# Patient Record
Sex: Female | Born: 1950
Health system: Southern US, Community
[De-identification: ages and names within clinical notes are randomized; demographics above are authoritative.]

## PROBLEM LIST (undated history)

## (undated) DIAGNOSIS — K219 Gastro-esophageal reflux disease without esophagitis: Secondary | ICD-10-CM

## (undated) DIAGNOSIS — K5900 Constipation, unspecified: Secondary | ICD-10-CM

## (undated) DIAGNOSIS — M1990 Unspecified osteoarthritis, unspecified site: Secondary | ICD-10-CM

## (undated) DIAGNOSIS — C4490 Unspecified malignant neoplasm of skin, unspecified: Secondary | ICD-10-CM

## (undated) DIAGNOSIS — R011 Cardiac murmur, unspecified: Secondary | ICD-10-CM

## (undated) DIAGNOSIS — M75100 Unspecified rotator cuff tear or rupture of unspecified shoulder, not specified as traumatic: Secondary | ICD-10-CM

## (undated) HISTORY — PX: FOOT SURGERY: SHX648

## (undated) HISTORY — PX: BLADDER SUSPENSION: SHX72

## (undated) HISTORY — PX: ROTATOR CUFF REPAIR: SHX139

## (undated) HISTORY — PX: BUNIONECTOMY: SHX129

## (undated) HISTORY — PX: ABDOMINAL HYSTERECTOMY: SHX81

## (undated) HISTORY — DX: Constipation, unspecified: K59.00

---

## 2015-03-13 DIAGNOSIS — I10 Essential (primary) hypertension: Secondary | ICD-10-CM | POA: Diagnosis not present

## 2015-03-13 DIAGNOSIS — K219 Gastro-esophageal reflux disease without esophagitis: Secondary | ICD-10-CM | POA: Diagnosis not present

## 2015-03-13 DIAGNOSIS — F3340 Major depressive disorder, recurrent, in remission, unspecified: Secondary | ICD-10-CM | POA: Diagnosis not present

## 2015-03-13 DIAGNOSIS — Z0000 Encounter for general adult medical examination without abnormal findings: Secondary | ICD-10-CM | POA: Diagnosis not present

## 2015-03-13 DIAGNOSIS — N8112 Cystocele, lateral: Secondary | ICD-10-CM | POA: Diagnosis not present

## 2015-03-13 DIAGNOSIS — E785 Hyperlipidemia, unspecified: Secondary | ICD-10-CM | POA: Diagnosis not present

## 2015-03-13 DIAGNOSIS — F419 Anxiety disorder, unspecified: Secondary | ICD-10-CM | POA: Diagnosis not present

## 2015-03-13 DIAGNOSIS — R350 Frequency of micturition: Secondary | ICD-10-CM | POA: Diagnosis not present

## 2015-03-25 DIAGNOSIS — M1990 Unspecified osteoarthritis, unspecified site: Secondary | ICD-10-CM | POA: Diagnosis not present

## 2015-04-14 DIAGNOSIS — J019 Acute sinusitis, unspecified: Secondary | ICD-10-CM | POA: Diagnosis not present

## 2015-04-14 DIAGNOSIS — R040 Epistaxis: Secondary | ICD-10-CM | POA: Diagnosis not present

## 2015-04-18 DIAGNOSIS — F329 Major depressive disorder, single episode, unspecified: Secondary | ICD-10-CM | POA: Diagnosis not present

## 2015-04-18 DIAGNOSIS — Z0001 Encounter for general adult medical examination with abnormal findings: Secondary | ICD-10-CM | POA: Diagnosis not present

## 2015-04-18 DIAGNOSIS — R5382 Chronic fatigue, unspecified: Secondary | ICD-10-CM | POA: Diagnosis not present

## 2015-04-18 DIAGNOSIS — K210 Gastro-esophageal reflux disease with esophagitis: Secondary | ICD-10-CM | POA: Diagnosis not present

## 2015-04-18 DIAGNOSIS — E782 Mixed hyperlipidemia: Secondary | ICD-10-CM | POA: Diagnosis not present

## 2015-04-21 DIAGNOSIS — E782 Mixed hyperlipidemia: Secondary | ICD-10-CM | POA: Diagnosis not present

## 2015-04-21 DIAGNOSIS — J301 Allergic rhinitis due to pollen: Secondary | ICD-10-CM | POA: Diagnosis not present

## 2015-04-21 DIAGNOSIS — Z1389 Encounter for screening for other disorder: Secondary | ICD-10-CM | POA: Diagnosis not present

## 2015-04-21 DIAGNOSIS — K219 Gastro-esophageal reflux disease without esophagitis: Secondary | ICD-10-CM | POA: Diagnosis not present

## 2015-04-21 DIAGNOSIS — F331 Major depressive disorder, recurrent, moderate: Secondary | ICD-10-CM | POA: Diagnosis not present

## 2015-04-21 DIAGNOSIS — Z23 Encounter for immunization: Secondary | ICD-10-CM | POA: Diagnosis not present

## 2015-04-21 DIAGNOSIS — Z0001 Encounter for general adult medical examination with abnormal findings: Secondary | ICD-10-CM | POA: Diagnosis not present

## 2015-04-21 DIAGNOSIS — Z9189 Other specified personal risk factors, not elsewhere classified: Secondary | ICD-10-CM | POA: Diagnosis not present

## 2015-05-05 DIAGNOSIS — M5136 Other intervertebral disc degeneration, lumbar region: Secondary | ICD-10-CM | POA: Diagnosis not present

## 2015-06-10 DIAGNOSIS — Z1231 Encounter for screening mammogram for malignant neoplasm of breast: Secondary | ICD-10-CM | POA: Diagnosis not present

## 2015-06-10 DIAGNOSIS — M1990 Unspecified osteoarthritis, unspecified site: Secondary | ICD-10-CM | POA: Diagnosis not present

## 2015-06-15 DIAGNOSIS — M5431 Sciatica, right side: Secondary | ICD-10-CM | POA: Diagnosis not present

## 2015-06-15 DIAGNOSIS — I10 Essential (primary) hypertension: Secondary | ICD-10-CM | POA: Diagnosis not present

## 2015-06-15 DIAGNOSIS — G250 Essential tremor: Secondary | ICD-10-CM | POA: Diagnosis not present

## 2015-07-30 DIAGNOSIS — M1990 Unspecified osteoarthritis, unspecified site: Secondary | ICD-10-CM | POA: Diagnosis not present

## 2015-08-20 DIAGNOSIS — M2022 Hallux rigidus, left foot: Secondary | ICD-10-CM | POA: Diagnosis not present

## 2015-08-20 DIAGNOSIS — M19072 Primary osteoarthritis, left ankle and foot: Secondary | ICD-10-CM | POA: Diagnosis not present

## 2015-09-29 DIAGNOSIS — Z4789 Encounter for other orthopedic aftercare: Secondary | ICD-10-CM | POA: Diagnosis not present

## 2015-10-22 DIAGNOSIS — E782 Mixed hyperlipidemia: Secondary | ICD-10-CM | POA: Diagnosis not present

## 2015-10-22 DIAGNOSIS — K219 Gastro-esophageal reflux disease without esophagitis: Secondary | ICD-10-CM | POA: Diagnosis not present

## 2015-10-22 DIAGNOSIS — Z9189 Other specified personal risk factors, not elsewhere classified: Secondary | ICD-10-CM | POA: Diagnosis not present

## 2015-10-22 DIAGNOSIS — F331 Major depressive disorder, recurrent, moderate: Secondary | ICD-10-CM | POA: Diagnosis not present

## 2015-10-22 DIAGNOSIS — J301 Allergic rhinitis due to pollen: Secondary | ICD-10-CM | POA: Diagnosis not present

## 2015-10-22 DIAGNOSIS — Z1212 Encounter for screening for malignant neoplasm of rectum: Secondary | ICD-10-CM | POA: Diagnosis not present

## 2015-11-03 DIAGNOSIS — Z4789 Encounter for other orthopedic aftercare: Secondary | ICD-10-CM | POA: Diagnosis not present

## 2016-01-29 DIAGNOSIS — Z23 Encounter for immunization: Secondary | ICD-10-CM | POA: Diagnosis not present

## 2016-02-05 DIAGNOSIS — S139XXA Sprain of joints and ligaments of unspecified parts of neck, initial encounter: Secondary | ICD-10-CM | POA: Diagnosis not present

## 2016-02-22 DIAGNOSIS — D485 Neoplasm of uncertain behavior of skin: Secondary | ICD-10-CM | POA: Diagnosis not present

## 2016-02-22 DIAGNOSIS — L905 Scar conditions and fibrosis of skin: Secondary | ICD-10-CM | POA: Diagnosis not present

## 2016-02-22 DIAGNOSIS — L821 Other seborrheic keratosis: Secondary | ICD-10-CM | POA: Diagnosis not present

## 2016-02-22 DIAGNOSIS — D2261 Melanocytic nevi of right upper limb, including shoulder: Secondary | ICD-10-CM | POA: Diagnosis not present

## 2016-02-22 DIAGNOSIS — D2262 Melanocytic nevi of left upper limb, including shoulder: Secondary | ICD-10-CM | POA: Diagnosis not present

## 2016-02-22 DIAGNOSIS — Z85828 Personal history of other malignant neoplasm of skin: Secondary | ICD-10-CM | POA: Diagnosis not present

## 2016-02-22 DIAGNOSIS — D1801 Hemangioma of skin and subcutaneous tissue: Secondary | ICD-10-CM | POA: Diagnosis not present

## 2016-02-22 DIAGNOSIS — L570 Actinic keratosis: Secondary | ICD-10-CM | POA: Diagnosis not present

## 2016-02-25 DIAGNOSIS — K5901 Slow transit constipation: Secondary | ICD-10-CM | POA: Diagnosis not present

## 2016-02-25 DIAGNOSIS — Z1212 Encounter for screening for malignant neoplasm of rectum: Secondary | ICD-10-CM | POA: Diagnosis not present

## 2016-02-25 DIAGNOSIS — Z6830 Body mass index (BMI) 30.0-30.9, adult: Secondary | ICD-10-CM | POA: Diagnosis not present

## 2016-03-02 ENCOUNTER — Encounter (INDEPENDENT_AMBULATORY_CARE_PROVIDER_SITE_OTHER): Payer: Self-pay | Admitting: Internal Medicine

## 2016-03-02 ENCOUNTER — Encounter (INDEPENDENT_AMBULATORY_CARE_PROVIDER_SITE_OTHER): Payer: Self-pay

## 2016-03-10 ENCOUNTER — Ambulatory Visit (INDEPENDENT_AMBULATORY_CARE_PROVIDER_SITE_OTHER): Payer: Medicare Other | Admitting: Internal Medicine

## 2016-03-11 ENCOUNTER — Encounter (INDEPENDENT_AMBULATORY_CARE_PROVIDER_SITE_OTHER): Payer: Self-pay | Admitting: Internal Medicine

## 2016-04-15 DIAGNOSIS — J0101 Acute recurrent maxillary sinusitis: Secondary | ICD-10-CM | POA: Diagnosis not present

## 2016-04-15 DIAGNOSIS — M545 Low back pain: Secondary | ICD-10-CM | POA: Diagnosis not present

## 2016-04-15 DIAGNOSIS — Z6830 Body mass index (BMI) 30.0-30.9, adult: Secondary | ICD-10-CM | POA: Diagnosis not present

## 2016-04-20 DIAGNOSIS — S161XXA Strain of muscle, fascia and tendon at neck level, initial encounter: Secondary | ICD-10-CM | POA: Diagnosis not present

## 2016-04-20 DIAGNOSIS — Z111 Encounter for screening for respiratory tuberculosis: Secondary | ICD-10-CM | POA: Diagnosis not present

## 2016-04-20 DIAGNOSIS — Z021 Encounter for pre-employment examination: Secondary | ICD-10-CM | POA: Diagnosis not present

## 2016-04-20 DIAGNOSIS — Z6830 Body mass index (BMI) 30.0-30.9, adult: Secondary | ICD-10-CM | POA: Diagnosis not present

## 2016-04-28 ENCOUNTER — Encounter (INDEPENDENT_AMBULATORY_CARE_PROVIDER_SITE_OTHER): Payer: Self-pay | Admitting: Internal Medicine

## 2016-04-28 ENCOUNTER — Encounter (INDEPENDENT_AMBULATORY_CARE_PROVIDER_SITE_OTHER): Payer: Self-pay | Admitting: *Deleted

## 2016-04-28 ENCOUNTER — Other Ambulatory Visit (INDEPENDENT_AMBULATORY_CARE_PROVIDER_SITE_OTHER): Payer: Self-pay | Admitting: Internal Medicine

## 2016-04-28 ENCOUNTER — Ambulatory Visit (INDEPENDENT_AMBULATORY_CARE_PROVIDER_SITE_OTHER): Payer: Medicare Other | Admitting: Internal Medicine

## 2016-04-28 ENCOUNTER — Telehealth (INDEPENDENT_AMBULATORY_CARE_PROVIDER_SITE_OTHER): Payer: Self-pay | Admitting: *Deleted

## 2016-04-28 DIAGNOSIS — Z86010 Personal history of colonic polyps: Secondary | ICD-10-CM | POA: Insufficient documentation

## 2016-04-28 DIAGNOSIS — K5909 Other constipation: Secondary | ICD-10-CM

## 2016-04-28 DIAGNOSIS — K5900 Constipation, unspecified: Secondary | ICD-10-CM

## 2016-04-28 HISTORY — DX: Constipation, unspecified: K59.00

## 2016-04-28 MED ORDER — PEG-KCL-NACL-NASULF-NA ASC-C 100 G PO SOLR: 1.0000 | Freq: Once | ORAL | 0 refills | Status: AC

## 2016-04-28 NOTE — Patient Instructions (Signed)
The risks and benefits such as perforation, bleeding, and infection were reviewed with the patient and is agreeable. 

## 2016-04-28 NOTE — Progress Notes (Signed)
   Subjective:    Patient ID: Dominique Brooks, female    DOB: 1950/10/06, 65 y.o.   MRN: [id number]  HPI Referred by Dr. Quillian Quince for constipation/screening colonoscopy. She had a colonoscopy 8 yrs ago at [facility] and she says she had a couple of polyps. Today she says she has rt sided abdominal pain and constipation. She has to strain to have a BM. The pain comes and goes when she is constipated. Rt sided abdominal pain with constipation. She usually has a BM one daily. She also takes prune juice. She takes Miralax as needed. No melena or BRRB.  Her appetite is good. No weight loss    Review of Systems Past Medical History:  Diagnosis Date  . Constipation 04/28/2016    No past surgical history on file.  Allergies  Allergen Reactions  . Sulfa Antibiotics     Flushing, red    No current outpatient prescriptions on file prior to visit.   No current facility-administered medications on file prior to visit.        Objective:   Physical Exam Blood pressure 132/80, pulse 64, temperature 97.3 F (36.3 C), height 5\' 7"  (1.702 m), weight 191 lb 9.6 oz (86.9 kg). Alert and oriented. Skin warm and dry. Oral mucosa is moist.   . Sclera anicteric, conjunctivae is pink. Thyroid not enlarged. No cervical lymphadenopathy. Lungs clear. Heart regular rate and rhythm.  Abdomen is soft. Bowel sounds are positive. No hepatomegaly. No abdominal masses felt. No tenderness.  No edema to lower extremities.         Assessment & Plan:  Constipation.. Take Miralax 1 scoop daily. Colonoscopy for hx of colon polyp.  The risks and benefits such as perforation, bleeding, and infection were reviewed with the patient and is agreeable.

## 2016-04-28 NOTE — Telephone Encounter (Signed)
Patient needs movi prep 

## 2016-05-18 DIAGNOSIS — N390 Urinary tract infection, site not specified: Secondary | ICD-10-CM | POA: Diagnosis not present

## 2016-05-18 DIAGNOSIS — I10 Essential (primary) hypertension: Secondary | ICD-10-CM | POA: Diagnosis not present

## 2016-05-18 DIAGNOSIS — E785 Hyperlipidemia, unspecified: Secondary | ICD-10-CM | POA: Diagnosis not present

## 2016-05-19 DIAGNOSIS — F325 Major depressive disorder, single episode, in full remission: Secondary | ICD-10-CM | POA: Diagnosis not present

## 2016-05-19 DIAGNOSIS — E785 Hyperlipidemia, unspecified: Secondary | ICD-10-CM | POA: Diagnosis not present

## 2016-05-19 DIAGNOSIS — N390 Urinary tract infection, site not specified: Secondary | ICD-10-CM | POA: Diagnosis not present

## 2016-05-19 DIAGNOSIS — K219 Gastro-esophageal reflux disease without esophagitis: Secondary | ICD-10-CM | POA: Diagnosis not present

## 2016-05-19 DIAGNOSIS — G250 Essential tremor: Secondary | ICD-10-CM | POA: Diagnosis not present

## 2016-05-19 DIAGNOSIS — I10 Essential (primary) hypertension: Secondary | ICD-10-CM | POA: Diagnosis not present

## 2016-05-19 DIAGNOSIS — Z0000 Encounter for general adult medical examination without abnormal findings: Secondary | ICD-10-CM | POA: Diagnosis not present

## 2016-05-19 DIAGNOSIS — I341 Nonrheumatic mitral (valve) prolapse: Secondary | ICD-10-CM | POA: Diagnosis not present

## 2016-05-20 DIAGNOSIS — E782 Mixed hyperlipidemia: Secondary | ICD-10-CM | POA: Diagnosis not present

## 2016-05-20 DIAGNOSIS — R5382 Chronic fatigue, unspecified: Secondary | ICD-10-CM | POA: Diagnosis not present

## 2016-05-20 DIAGNOSIS — K219 Gastro-esophageal reflux disease without esophagitis: Secondary | ICD-10-CM | POA: Diagnosis not present

## 2016-05-23 DIAGNOSIS — J301 Allergic rhinitis due to pollen: Secondary | ICD-10-CM | POA: Diagnosis not present

## 2016-05-23 DIAGNOSIS — Z9189 Other specified personal risk factors, not elsewhere classified: Secondary | ICD-10-CM | POA: Diagnosis not present

## 2016-05-23 DIAGNOSIS — Z23 Encounter for immunization: Secondary | ICD-10-CM | POA: Diagnosis not present

## 2016-05-23 DIAGNOSIS — E782 Mixed hyperlipidemia: Secondary | ICD-10-CM | POA: Diagnosis not present

## 2016-05-23 DIAGNOSIS — Z0001 Encounter for general adult medical examination with abnormal findings: Secondary | ICD-10-CM | POA: Diagnosis not present

## 2016-05-23 DIAGNOSIS — F331 Major depressive disorder, recurrent, moderate: Secondary | ICD-10-CM | POA: Diagnosis not present

## 2016-05-23 DIAGNOSIS — Z6829 Body mass index (BMI) 29.0-29.9, adult: Secondary | ICD-10-CM | POA: Diagnosis not present

## 2016-05-23 DIAGNOSIS — K219 Gastro-esophageal reflux disease without esophagitis: Secondary | ICD-10-CM | POA: Diagnosis not present

## 2016-06-02 DIAGNOSIS — L821 Other seborrheic keratosis: Secondary | ICD-10-CM | POA: Diagnosis not present

## 2016-06-02 DIAGNOSIS — Z85828 Personal history of other malignant neoplasm of skin: Secondary | ICD-10-CM | POA: Diagnosis not present

## 2016-06-02 DIAGNOSIS — L570 Actinic keratosis: Secondary | ICD-10-CM | POA: Diagnosis not present

## 2016-06-22 ENCOUNTER — Ambulatory Visit (HOSPITAL_COMMUNITY)
Admission: RE | Admit: 2016-06-22 | Discharge: 2016-06-22 | Disposition: A | Discharge status: Home / Self Care | Payer: Medicare Other | Source: Ambulatory Visit | Attending: Internal Medicine | Admitting: Internal Medicine

## 2016-06-22 ENCOUNTER — Encounter (HOSPITAL_COMMUNITY): Payer: Self-pay | Admitting: *Deleted

## 2016-06-22 ENCOUNTER — Encounter (HOSPITAL_COMMUNITY)
Admission: RE | Disposition: A | Discharge status: Home / Self Care | Payer: Self-pay | Source: Ambulatory Visit | Attending: Internal Medicine

## 2016-06-22 DIAGNOSIS — K219 Gastro-esophageal reflux disease without esophagitis: Secondary | ICD-10-CM | POA: Diagnosis not present

## 2016-06-22 DIAGNOSIS — Z09 Encounter for follow-up examination after completed treatment for conditions other than malignant neoplasm: Secondary | ICD-10-CM | POA: Diagnosis not present

## 2016-06-22 DIAGNOSIS — D125 Benign neoplasm of sigmoid colon: Secondary | ICD-10-CM | POA: Diagnosis not present

## 2016-06-22 DIAGNOSIS — K5730 Diverticulosis of large intestine without perforation or abscess without bleeding: Secondary | ICD-10-CM | POA: Diagnosis not present

## 2016-06-22 DIAGNOSIS — Z1211 Encounter for screening for malignant neoplasm of colon: Secondary | ICD-10-CM | POA: Diagnosis not present

## 2016-06-22 DIAGNOSIS — Z86010 Personal history of colonic polyps: Secondary | ICD-10-CM

## 2016-06-22 DIAGNOSIS — Z79899 Other long term (current) drug therapy: Secondary | ICD-10-CM | POA: Insufficient documentation

## 2016-06-22 HISTORY — DX: Gastro-esophageal reflux disease without esophagitis: K21.9

## 2016-06-22 HISTORY — PX: COLONOSCOPY: SHX5424

## 2016-06-22 HISTORY — PX: POLYPECTOMY: SHX5525

## 2016-06-22 SURGERY — COLONOSCOPY
Anesthesia: Moderate Sedation

## 2016-06-22 MED ORDER — MIDAZOLAM HCL 5 MG/5ML IJ SOLN
INTRAMUSCULAR | Status: DC | PRN
  Administered 2016-06-22 (×4): 2 mg via INTRAVENOUS

## 2016-06-22 MED ORDER — MEPERIDINE HCL 50 MG/ML IJ SOLN
INTRAMUSCULAR | Status: DC | PRN
  Administered 2016-06-22 (×2): 25 mg via INTRAVENOUS

## 2016-06-22 MED ORDER — MEPERIDINE HCL 50 MG/ML IJ SOLN
INTRAMUSCULAR | Status: AC
  Filled 2016-06-22: qty 1

## 2016-06-22 MED ORDER — MIDAZOLAM HCL 5 MG/5ML IJ SOLN
INTRAMUSCULAR | Status: AC
  Filled 2016-06-22: qty 10

## 2016-06-22 MED ORDER — STERILE WATER FOR IRRIGATION IR SOLN
Status: DC | PRN
  Administered 2016-06-22: 13:00:00

## 2016-06-22 MED ORDER — SODIUM CHLORIDE 0.9 % IV SOLN
INTRAVENOUS | Status: DC
  Administered 2016-06-22: 13:00:00 via INTRAVENOUS

## 2016-06-22 NOTE — Discharge Instructions (Signed)
Resume usual medications and high fiber diet. No driving for 24 hours. Physician will call with biopsy results.      Colonoscopy, Adult, Care After This sheet gives you information about how to care for yourself after your procedure. Your doctor may also give you more specific instructions. If you have problems or questions, call your doctor. Follow these instructions at home: General instructions  For the first 24 hours after the procedure:  Do not drive or use machinery.  Do not sign important documents.  Do not drink alcohol.  Do your daily activities more slowly than normal.  Eat foods that are soft and easy to digest.  Rest often.  Take over-the-counter or prescription medicines only as told by your doctor.  It is up to you to get the results of your procedure. Ask your doctor, or the department performing the procedure, when your results will be ready. To help cramping and bloating:  Try walking around.  Put heat on your belly (abdomen) as told by your doctor. Use a heat source that your doctor recommends, such as a moist heat pack or a heating pad.  Put a towel between your skin and the heat source.  Leave the heat on for 20-30 minutes.  Remove the heat if your skin turns bright red. This is especially important if you cannot feel pain, heat, or cold. You can get burned. Eating and drinking  Drink enough fluid to keep your pee (urine) clear or pale yellow.  Return to your normal diet as told by your doctor. Avoid heavy or fried foods that are hard to digest.  Avoid drinking alcohol for as long as told by your doctor. Contact a doctor if:  You have blood in your poop (stool) 2-3 days after the procedure. Get help right away if:  You have more than a small amount of blood in your poop.  You see large clumps of tissue (blood clots) in your poop.  Your belly is swollen.  You feel sick to your stomach (nauseous).  You throw up (vomit).  You have a  fever.  You have belly pain that gets worse, and medicine does not help your pain.    Colon Polyps Introduction Polyps are tissue growths inside the body. Polyps can grow in many places, including the large intestine (colon). A polyp may be a round bump or a mushroom-shaped growth. You could have one polyp or several. Most colon polyps are noncancerous (benign). However, some colon polyps can become cancerous over time. What are the causes? The exact cause of colon polyps is not known. What increases the risk? This condition is more likely to develop in people who:  Have a family history of colon cancer or colon polyps.  Are older than 66 or older than 45 if they are African American.  Have inflammatory bowel disease, such as ulcerative colitis or Crohn disease.  Are overweight.  Smoke cigarettes.  Do not get enough exercise.  Drink too much alcohol.  Eat a diet that is:  High in fat and red meat.  Low in fiber.  Had childhood cancer that was treated with abdominal radiation. What are the signs or symptoms? Most polyps do not cause symptoms. If you have symptoms, they may include:  Blood coming from your rectum when having a bowel movement.  Blood in your stool.The stool may look dark red or black.  A change in bowel habits, such as constipation or diarrhea. How is this diagnosed? This condition is diagnosed  with a colonoscopy. This is a procedure that uses a lighted, flexible scope to look at the inside of your colon. How is this treated? Treatment for this condition involves removing any polyps that are found. Those polyps will then be tested for cancer. If cancer is found, your health care provider will talk to you about options for colon cancer treatment. Follow these instructions at home: Diet  Eat plenty of fiber, such as fruits, vegetables, and whole grains.  Eat foods that are high in calcium and vitamin D, such as milk, cheese, yogurt, eggs, liver, fish,  and broccoli.  Limit foods high in fat, red meats, and processed meats, such as hot dogs, sausage, bacon, and lunch meats.  Maintain a healthy weight, or lose weight if recommended by your health care provider. General instructions  Do not smoke cigarettes.  Do not drink alcohol excessively.  Keep all follow-up visits as told by your health care provider. This is important. This includes keeping regularly scheduled colonoscopies. Talk to your health care provider about when you need a colonoscopy.  Exercise every day or as told by your health care provider. Contact a health care provider if:  You have new or worsening bleeding during a bowel movement.  You have new or increased blood in your stool.  You have a change in bowel habits.  You unexpectedly lose weight.    High-Fiber Diet Fiber, also called dietary fiber, is a type of carbohydrate found in fruits, vegetables, whole grains, and beans. A high-fiber diet can have many health benefits. Your health care provider may recommend a high-fiber diet to help:  Prevent constipation. Fiber can make your bowel movements more regular.  Lower your cholesterol.  Relieve hemorrhoids, uncomplicated diverticulosis, or irritable bowel syndrome.  Prevent overeating as part of a weight-loss plan.  Prevent heart disease, type 2 diabetes, and certain cancers. What is my plan? The recommended daily intake of fiber includes:  38 grams for men under age 23.  4 grams for men over age 63.  21 grams for women under age 76.  1 grams for women over age 91. You can get the recommended daily intake of dietary fiber by eating a variety of fruits, vegetables, grains, and beans. Your health care provider may also recommend a fiber supplement if it is not possible to get enough fiber through your diet. What do I need to know about a high-fiber diet?  Fiber supplements have not been widely studied for their effectiveness, so it is better to  get fiber through food sources.  Always check the fiber content on thenutrition facts label of any prepackaged food. Look for foods that contain at least 5 grams of fiber per serving.  Ask your dietitian if you have questions about specific foods that are related to your condition, especially if those foods are not listed in the following section.  Increase your daily fiber consumption gradually. Increasing your intake of dietary fiber too quickly may cause bloating, cramping, or gas.  Drink plenty of water. Water helps you to digest fiber. What foods can I eat? Grains  Whole-grain breads. Multigrain cereal. Oats and oatmeal. Brown rice. Barley. Bulgur wheat. Carey. Bran muffins. Popcorn. Rye wafer crackers. Vegetables  Sweet potatoes. Spinach. Kale. Artichokes. Cabbage. Broccoli. Green peas. Carrots. Squash. Fruits  Berries. Pears. Apples. Oranges. Avocados. Prunes and raisins. Dried figs. Meats and Other Protein Sources  Navy, kidney, pinto, and soy beans. Split peas. Lentils. Nuts and seeds. Dairy  Fiber-fortified yogurt. Beverages  Fiber-fortified  soy milk. Fiber-fortified orange juice. Other  Fiber bars. The items listed above may not be a complete list of recommended foods or beverages. Contact your dietitian for more options.  What foods are not recommended? Grains  White bread. Pasta made with refined flour. White rice. Vegetables  Fried potatoes. Canned vegetables. Well-cooked vegetables. Fruits  Fruit juice. Cooked, strained fruit. Meats and Other Protein Sources  Fatty cuts of meat. Fried Sales executive or fried fish. Dairy  Milk. Yogurt. Cream cheese. Sour cream. Beverages  Soft drinks. Other  Cakes and pastries. Butter and oils. The items listed above may not be a complete list of foods and beverages to avoid. Contact your dietitian for more information.  What are some tips for including high-fiber foods in my diet?  Eat a wide variety of high-fiber foods.  Make  sure that half of all grains consumed each day are whole grains.  Replace breads and cereals made from refined flour or white flour with whole-grain breads and cereals.  Replace white rice with brown rice, bulgur wheat, or millet.  Start the day with a breakfast that is high in fiber, such as a cereal that contains at least 5 grams of fiber per serving.  Use beans in place of meat in soups, salads, or pasta.  Eat high-fiber snacks, such as berries, raw vegetables, nuts, or popcorn. This information is not intended to replace advice given to you by your health care provider. Make sure you discuss any questions you have with your health care provider. Document Released: 04/25/2005 Document Revised: 10/01/2015 Document Reviewed: 10/08/2013 Elsevier Interactive Patient Education  2017 [employer].

## 2016-06-22 NOTE — H&P (Signed)
Dominique Brooks is an 66 y.o. female.   Chief Complaint: Patient is here for colonoscopy. HPI: Asian is 1 old Caucasian female who is here for screening colonoscopy. She had colonoscopy 10 years ago when she was living in an [place]. She was told she had polyps however none were removed. It appears there may have been misunderstanding or she has had rectal hyperplastic polyps which are not removed. At any rate she is not having rectal bleeding and has not noted change in her bowel habits. Family history is negative for CRC.  Past Medical History:  Diagnosis Date  . Constipation 04/28/2016  . GERD (gastroesophageal reflux disease)        Stress disorder  Past Surgical History:  Procedure Laterality Date  . ABDOMINAL HYSTERECTOMY    . BLADDER SUSPENSION    . BUNIONECTOMY     x 2    History reviewed. No pertinent family history. Social History:  reports that she has never smoked. She has never used smokeless tobacco. She reports that she does not drink alcohol or use drugs.  Allergies:  Allergies  Allergen Reactions  . Sulfa Antibiotics     Flushing, red    Medications Prior to Admission  Medication Sig Dispense Refill  . ALPRAZolam (XANAX) 0.5 MG tablet Take 0.25-0.5 mg by mouth 2 (two) times daily as needed (muscle spasms).    . citalopram (CELEXA) 40 MG tablet Take 20 mg by mouth daily.    . fexofenadine (ALLEGRA) 180 MG tablet Take 180 mg by mouth daily.    . Multiple Vitamins-Minerals (MULTIVITAMIN WITH MINERALS) tablet Take 1 tablet by mouth daily.    . pantoprazole (PROTONIX) 40 MG tablet Take 40 mg by mouth daily.      No results found for this or any previous visit (from the past 48 hour(s)). No results found.  ROS  Blood pressure 114/90, pulse 62, temperature 97.8 F (36.6 C), temperature source Oral, resp. rate 12, SpO2 97 %. Physical Exam  Constitutional: She appears well-developed and well-nourished.  HENT:  Mouth/Throat: Oropharynx is clear and moist.   Eyes: Conjunctivae are normal. No scleral icterus.  Neck: No thyromegaly present.  Cardiovascular: Normal rate, regular rhythm and normal heart sounds.   No murmur heard. Respiratory: Effort normal and breath sounds normal.  GI:  Abdomen is soft and nontender without organomegaly or masses.  Lymphadenopathy:    She has no cervical adenopathy.     Assessment/Plan Average risk screening colonoscopy.  Hildred Laser, MD 06/22/2016, 1:08 PM

## 2016-06-22 NOTE — Op Note (Addendum)
[facility] Patient Name: Dominique Brooks Procedure Date: 06/22/2016 11:48 AM MRN: [id number] Date of Birth: Apr 04, 1951 Attending MD: Hildred Laser , MD CSN: [id number] Age: 66 Admit Type: Outpatient Procedure:                Colonoscopy Indications:              Screening for colorectal malignant neoplasm Providers:                Hildred Laser, MD, Otis Peak B. Sharon Seller, RN, Isabella Stalling, Technician Referring MD:             Gar Ponto, MD Medicines:                Meperidine 50 mg IV, Midazolam 8 mg IV Complications:            No immediate complications. Estimated Blood Loss:     Estimated blood loss was minimal. Procedure:                Pre-Anesthesia Assessment:                           - Prior to the procedure, a History and Physical                            was performed, and patient medications and                            allergies were reviewed. The patient's tolerance of                            previous anesthesia was also reviewed. The risks                            and benefits of the procedure and the sedation                            options and risks were discussed with the patient.                            All questions were answered, and informed consent                            was obtained. Prior Anticoagulants: The patient has                            taken no previous anticoagulant or antiplatelet                            agents. ASA Grade Assessment: II - A patient with                            mild systemic disease. After reviewing the risks  and benefits, the patient was deemed in                            satisfactory condition to undergo the procedure.                           After obtaining informed consent, the colonoscope                            was passed under direct vision. Throughout the                            procedure, the patient's blood pressure, pulse, and                             oxygen saturations were monitored continuously. The                            [id number]) was introduced through the                            anus and advanced to the the cecum, identified by                            appendiceal orifice and ileocecal valve. The                            colonoscopy was performed without difficulty. The                            patient tolerated the procedure well. The quality                            of the bowel preparation was good. The ileocecal                            valve, appendiceal orifice, and rectum were                            photographed. Scope In: 1:20:08 PM Scope Out: 1:45:41 PM Scope Withdrawal Time: 0 hours 13 minutes 37 seconds  Total Procedure Duration: 0 hours 25 minutes 33 seconds  Findings:      The perianal and digital rectal examinations were normal.      Multiple small and large-mouthed diverticula were found in the entire       colon.      A 4 mm polyp was found in the sigmoid colon. The polyp was sessile. The       polyp was removed with a cold snare. Resection complete but polyp lost.      The retroflexed view of the distal rectum and anal verge was normal and       showed no anal or rectal abnormalities. Impression:               - Diverticulosis in the entire examined colon.                           -  One 4 mm polyp in the sigmoid colon, removed with                            a cold snare. Resected but lost. endoscopic                            appearence consistent with small adenoma. Moderate Sedation:      Moderate (conscious) sedation was administered by the endoscopy nurse       and supervised by the endoscopist. The following parameters were       monitored: oxygen saturation, heart rate, blood pressure, CO2       capnography and response to care. Total physician intraservice time was       30 minutes. Recommendation:           - Patient has a contact number  available for                            emergencies. The signs and symptoms of potential                            delayed complications were discussed with the                            patient. Return to normal activities tomorrow.                            Written discharge instructions were provided to the                            patient.                           - High fiber diet today.                           - Continue present medications.                           - Repeat colonoscopy in 7 years Procedure Code(s):        --- Professional ---                           [id number], Colonoscopy, flexible; with removal of                            tumor(s), polyp(s), or other lesion(s) by snare                            technique                           99152, Moderate sedation services provided by the                            same physician or other qualified health care  professional performing the diagnostic or                            therapeutic service that the sedation supports,                            requiring the presence of an independent trained                            observer to assist in the monitoring of the                            patient's level of consciousness and physiological                            status; initial 15 minutes of intraservice time,                            patient age 81 years or older                           [phone], Moderate sedation services; each additional                            15 minutes intraservice time Diagnosis Code(s):        --- Professional ---                           Z12.11, Encounter for screening for malignant                            neoplasm of colon                           D12.5, Benign neoplasm of sigmoid colon                           K57.30, Diverticulosis of large intestine without                            perforation or abscess without bleeding CPT copyright  2016 American Medical Association. All rights reserved. The codes documented in this report are preliminary and upon coder review may  be revised to meet current compliance requirements. Hildred Laser, MD Hildred Laser, MD 06/22/2016 1:51:11 PM This report has been signed electronically. Number of Addenda: 0

## 2016-06-23 DIAGNOSIS — Z6830 Body mass index (BMI) 30.0-30.9, adult: Secondary | ICD-10-CM | POA: Diagnosis not present

## 2016-06-23 DIAGNOSIS — J0101 Acute recurrent maxillary sinusitis: Secondary | ICD-10-CM | POA: Diagnosis not present

## 2016-06-29 ENCOUNTER — Encounter (HOSPITAL_COMMUNITY): Payer: Self-pay | Admitting: Internal Medicine

## 2016-07-11 DIAGNOSIS — J019 Acute sinusitis, unspecified: Secondary | ICD-10-CM | POA: Diagnosis not present

## 2016-07-11 DIAGNOSIS — R05 Cough: Secondary | ICD-10-CM | POA: Diagnosis not present

## 2016-07-12 ENCOUNTER — Telehealth (INDEPENDENT_AMBULATORY_CARE_PROVIDER_SITE_OTHER): Payer: Self-pay | Admitting: Internal Medicine

## 2016-07-12 NOTE — Telephone Encounter (Signed)
Patient called, very concerned because she had a colonoscopy on 06/22/16 and has not heard anything regarding the results.  She's hoping and praying everything was ok.  She would like a call back.  [phone]

## 2016-07-12 NOTE — Telephone Encounter (Addendum)
I have looked at patient chart and do not see results under labs that would have created a result for the basket to be reviewed.  I have asked Threasa Beards to look behind me to make sure about specimen.  Thank you   There was no polyp retrieved.  I need Dr. Laural Golden to call patient.

## 2016-07-13 NOTE — Telephone Encounter (Signed)
Talked with Dr.Rehman, he is calling with patient. Forwarded to [place] for documentation.

## 2016-07-14 NOTE — Telephone Encounter (Signed)
Explained to the patient that she had small polyp which was lost. This polyp well documented on endoscopic pictures and also following cold snare. Return for follow-up in colonoscopy in 7 years.

## 2016-07-14 NOTE — Telephone Encounter (Signed)
Talked with patient yesterday.

## 2016-07-14 NOTE — Telephone Encounter (Signed)
7 yr TCS noted in recall 

## 2016-09-07 DIAGNOSIS — Z6830 Body mass index (BMI) 30.0-30.9, adult: Secondary | ICD-10-CM | POA: Diagnosis not present

## 2016-09-07 DIAGNOSIS — R350 Frequency of micturition: Secondary | ICD-10-CM | POA: Diagnosis not present

## 2016-09-16 DIAGNOSIS — Z6829 Body mass index (BMI) 29.0-29.9, adult: Secondary | ICD-10-CM | POA: Diagnosis not present

## 2016-09-16 DIAGNOSIS — J0101 Acute recurrent maxillary sinusitis: Secondary | ICD-10-CM | POA: Diagnosis not present

## 2016-12-02 DIAGNOSIS — Z6828 Body mass index (BMI) 28.0-28.9, adult: Secondary | ICD-10-CM | POA: Diagnosis not present

## 2016-12-02 DIAGNOSIS — M7582 Other shoulder lesions, left shoulder: Secondary | ICD-10-CM | POA: Diagnosis not present

## 2016-12-15 DIAGNOSIS — Z1231 Encounter for screening mammogram for malignant neoplasm of breast: Secondary | ICD-10-CM | POA: Diagnosis not present

## 2016-12-26 DIAGNOSIS — Z6829 Body mass index (BMI) 29.0-29.9, adult: Secondary | ICD-10-CM | POA: Diagnosis not present

## 2016-12-26 DIAGNOSIS — R300 Dysuria: Secondary | ICD-10-CM | POA: Diagnosis not present

## 2016-12-26 DIAGNOSIS — N3000 Acute cystitis without hematuria: Secondary | ICD-10-CM | POA: Diagnosis not present

## 2017-02-14 DIAGNOSIS — Z23 Encounter for immunization: Secondary | ICD-10-CM | POA: Diagnosis not present

## 2017-05-25 DIAGNOSIS — G250 Essential tremor: Secondary | ICD-10-CM | POA: Diagnosis not present

## 2017-05-25 DIAGNOSIS — Z0000 Encounter for general adult medical examination without abnormal findings: Secondary | ICD-10-CM | POA: Diagnosis not present

## 2017-05-25 DIAGNOSIS — F325 Major depressive disorder, single episode, in full remission: Secondary | ICD-10-CM | POA: Diagnosis not present

## 2017-05-25 DIAGNOSIS — N390 Urinary tract infection, site not specified: Secondary | ICD-10-CM | POA: Diagnosis not present

## 2017-05-25 DIAGNOSIS — M47812 Spondylosis without myelopathy or radiculopathy, cervical region: Secondary | ICD-10-CM | POA: Diagnosis not present

## 2017-05-25 DIAGNOSIS — K219 Gastro-esophageal reflux disease without esophagitis: Secondary | ICD-10-CM | POA: Diagnosis not present

## 2017-05-25 DIAGNOSIS — M19011 Primary osteoarthritis, right shoulder: Secondary | ICD-10-CM | POA: Diagnosis not present

## 2017-05-25 DIAGNOSIS — E785 Hyperlipidemia, unspecified: Secondary | ICD-10-CM | POA: Diagnosis not present

## 2017-05-25 DIAGNOSIS — I341 Nonrheumatic mitral (valve) prolapse: Secondary | ICD-10-CM | POA: Diagnosis not present

## 2017-05-25 DIAGNOSIS — M1710 Unilateral primary osteoarthritis, unspecified knee: Secondary | ICD-10-CM | POA: Diagnosis not present

## 2017-05-25 DIAGNOSIS — I10 Essential (primary) hypertension: Secondary | ICD-10-CM | POA: Diagnosis not present

## 2017-05-29 DIAGNOSIS — C44719 Basal cell carcinoma of skin of left lower limb, including hip: Secondary | ICD-10-CM | POA: Diagnosis not present

## 2017-05-29 DIAGNOSIS — L570 Actinic keratosis: Secondary | ICD-10-CM | POA: Diagnosis not present

## 2017-05-29 DIAGNOSIS — D225 Melanocytic nevi of trunk: Secondary | ICD-10-CM | POA: Diagnosis not present

## 2017-05-29 DIAGNOSIS — Z85828 Personal history of other malignant neoplasm of skin: Secondary | ICD-10-CM | POA: Diagnosis not present

## 2017-05-29 DIAGNOSIS — L821 Other seborrheic keratosis: Secondary | ICD-10-CM | POA: Diagnosis not present

## 2017-05-29 DIAGNOSIS — D2271 Melanocytic nevi of right lower limb, including hip: Secondary | ICD-10-CM | POA: Diagnosis not present

## 2017-05-29 DIAGNOSIS — D485 Neoplasm of uncertain behavior of skin: Secondary | ICD-10-CM | POA: Diagnosis not present

## 2017-05-29 DIAGNOSIS — D2272 Melanocytic nevi of left lower limb, including hip: Secondary | ICD-10-CM | POA: Diagnosis not present

## 2017-06-19 DIAGNOSIS — Z85828 Personal history of other malignant neoplasm of skin: Secondary | ICD-10-CM | POA: Diagnosis not present

## 2017-06-19 DIAGNOSIS — C44719 Basal cell carcinoma of skin of left lower limb, including hip: Secondary | ICD-10-CM | POA: Diagnosis not present

## 2017-07-18 DIAGNOSIS — R05 Cough: Secondary | ICD-10-CM | POA: Diagnosis not present

## 2017-07-18 DIAGNOSIS — J3489 Other specified disorders of nose and nasal sinuses: Secondary | ICD-10-CM | POA: Diagnosis not present

## 2017-07-18 DIAGNOSIS — M7581 Other shoulder lesions, right shoulder: Secondary | ICD-10-CM | POA: Diagnosis not present

## 2017-07-18 DIAGNOSIS — M5412 Radiculopathy, cervical region: Secondary | ICD-10-CM | POA: Diagnosis not present

## 2017-07-18 DIAGNOSIS — M19011 Primary osteoarthritis, right shoulder: Secondary | ICD-10-CM | POA: Diagnosis not present

## 2017-08-01 DIAGNOSIS — J209 Acute bronchitis, unspecified: Secondary | ICD-10-CM | POA: Diagnosis not present

## 2017-08-01 DIAGNOSIS — J0101 Acute recurrent maxillary sinusitis: Secondary | ICD-10-CM | POA: Diagnosis not present

## 2017-08-01 DIAGNOSIS — Z6830 Body mass index (BMI) 30.0-30.9, adult: Secondary | ICD-10-CM | POA: Diagnosis not present

## 2017-08-23 DIAGNOSIS — J0101 Acute recurrent maxillary sinusitis: Secondary | ICD-10-CM | POA: Diagnosis not present

## 2017-08-23 DIAGNOSIS — D1722 Benign lipomatous neoplasm of skin and subcutaneous tissue of left arm: Secondary | ICD-10-CM | POA: Diagnosis not present

## 2017-08-23 DIAGNOSIS — Z6830 Body mass index (BMI) 30.0-30.9, adult: Secondary | ICD-10-CM | POA: Diagnosis not present

## 2017-09-30 DIAGNOSIS — H40033 Anatomical narrow angle, bilateral: Secondary | ICD-10-CM | POA: Diagnosis not present

## 2017-09-30 DIAGNOSIS — H2513 Age-related nuclear cataract, bilateral: Secondary | ICD-10-CM | POA: Diagnosis not present

## 2017-10-10 DIAGNOSIS — H04123 Dry eye syndrome of bilateral lacrimal glands: Secondary | ICD-10-CM | POA: Diagnosis not present

## 2017-11-03 DIAGNOSIS — M545 Low back pain: Secondary | ICD-10-CM | POA: Diagnosis not present

## 2017-11-03 DIAGNOSIS — E782 Mixed hyperlipidemia: Secondary | ICD-10-CM | POA: Diagnosis not present

## 2017-11-03 DIAGNOSIS — K210 Gastro-esophageal reflux disease with esophagitis: Secondary | ICD-10-CM | POA: Diagnosis not present

## 2017-11-03 DIAGNOSIS — R5382 Chronic fatigue, unspecified: Secondary | ICD-10-CM | POA: Diagnosis not present

## 2017-11-03 DIAGNOSIS — Z9189 Other specified personal risk factors, not elsewhere classified: Secondary | ICD-10-CM | POA: Diagnosis not present

## 2017-11-07 DIAGNOSIS — Z6830 Body mass index (BMI) 30.0-30.9, adult: Secondary | ICD-10-CM | POA: Diagnosis not present

## 2017-11-07 DIAGNOSIS — E6609 Other obesity due to excess calories: Secondary | ICD-10-CM | POA: Diagnosis not present

## 2017-11-07 DIAGNOSIS — F331 Major depressive disorder, recurrent, moderate: Secondary | ICD-10-CM | POA: Diagnosis not present

## 2017-11-07 DIAGNOSIS — K219 Gastro-esophageal reflux disease without esophagitis: Secondary | ICD-10-CM | POA: Diagnosis not present

## 2017-11-07 DIAGNOSIS — E782 Mixed hyperlipidemia: Secondary | ICD-10-CM | POA: Diagnosis not present

## 2017-11-07 DIAGNOSIS — Z1212 Encounter for screening for malignant neoplasm of rectum: Secondary | ICD-10-CM | POA: Diagnosis not present

## 2017-11-07 DIAGNOSIS — G4700 Insomnia, unspecified: Secondary | ICD-10-CM | POA: Diagnosis not present

## 2017-11-07 DIAGNOSIS — R5382 Chronic fatigue, unspecified: Secondary | ICD-10-CM | POA: Diagnosis not present

## 2017-11-23 DIAGNOSIS — M7581 Other shoulder lesions, right shoulder: Secondary | ICD-10-CM | POA: Diagnosis not present

## 2017-11-28 DIAGNOSIS — Z6830 Body mass index (BMI) 30.0-30.9, adult: Secondary | ICD-10-CM | POA: Diagnosis not present

## 2017-11-28 DIAGNOSIS — G4700 Insomnia, unspecified: Secondary | ICD-10-CM | POA: Diagnosis not present

## 2017-11-28 DIAGNOSIS — R946 Abnormal results of thyroid function studies: Secondary | ICD-10-CM | POA: Diagnosis not present

## 2017-11-28 DIAGNOSIS — M25511 Pain in right shoulder: Secondary | ICD-10-CM | POA: Diagnosis not present

## 2017-11-28 DIAGNOSIS — R5383 Other fatigue: Secondary | ICD-10-CM | POA: Diagnosis not present

## 2017-11-30 DIAGNOSIS — E2839 Other primary ovarian failure: Secondary | ICD-10-CM | POA: Diagnosis not present

## 2017-11-30 DIAGNOSIS — M810 Age-related osteoporosis without current pathological fracture: Secondary | ICD-10-CM | POA: Diagnosis not present

## 2017-12-15 DIAGNOSIS — M542 Cervicalgia: Secondary | ICD-10-CM | POA: Diagnosis not present

## 2017-12-15 DIAGNOSIS — M25511 Pain in right shoulder: Secondary | ICD-10-CM | POA: Diagnosis not present

## 2017-12-18 DIAGNOSIS — Z1231 Encounter for screening mammogram for malignant neoplasm of breast: Secondary | ICD-10-CM | POA: Diagnosis not present

## 2017-12-28 DIAGNOSIS — Z85828 Personal history of other malignant neoplasm of skin: Secondary | ICD-10-CM | POA: Diagnosis not present

## 2017-12-28 DIAGNOSIS — L570 Actinic keratosis: Secondary | ICD-10-CM | POA: Diagnosis not present

## 2017-12-28 DIAGNOSIS — L821 Other seborrheic keratosis: Secondary | ICD-10-CM | POA: Diagnosis not present

## 2017-12-28 DIAGNOSIS — D692 Other nonthrombocytopenic purpura: Secondary | ICD-10-CM | POA: Diagnosis not present

## 2017-12-31 DIAGNOSIS — S61011A Laceration without foreign body of right thumb without damage to nail, initial encounter: Secondary | ICD-10-CM | POA: Diagnosis not present

## 2017-12-31 DIAGNOSIS — S63124A Dislocation of unspecified interphalangeal joint of right thumb, initial encounter: Secondary | ICD-10-CM | POA: Diagnosis not present

## 2017-12-31 DIAGNOSIS — S62631A Displaced fracture of distal phalanx of left index finger, initial encounter for closed fracture: Secondary | ICD-10-CM | POA: Diagnosis not present

## 2017-12-31 DIAGNOSIS — W268XXA Contact with other sharp object(s), not elsewhere classified, initial encounter: Secondary | ICD-10-CM | POA: Diagnosis not present

## 2017-12-31 DIAGNOSIS — S63114A Dislocation of metacarpophalangeal joint of right thumb, initial encounter: Secondary | ICD-10-CM | POA: Diagnosis not present

## 2017-12-31 DIAGNOSIS — Z23 Encounter for immunization: Secondary | ICD-10-CM | POA: Diagnosis not present

## 2018-01-10 DIAGNOSIS — S63104A Unspecified dislocation of right thumb, initial encounter: Secondary | ICD-10-CM | POA: Diagnosis not present

## 2018-01-12 DIAGNOSIS — M25571 Pain in right ankle and joints of right foot: Secondary | ICD-10-CM | POA: Diagnosis not present

## 2018-01-27 DIAGNOSIS — Z23 Encounter for immunization: Secondary | ICD-10-CM | POA: Diagnosis not present

## 2018-01-29 DIAGNOSIS — R5383 Other fatigue: Secondary | ICD-10-CM | POA: Diagnosis not present

## 2018-01-29 DIAGNOSIS — Z6831 Body mass index (BMI) 31.0-31.9, adult: Secondary | ICD-10-CM | POA: Diagnosis not present

## 2018-01-29 DIAGNOSIS — R509 Fever, unspecified: Secondary | ICD-10-CM | POA: Diagnosis not present

## 2018-02-21 DIAGNOSIS — S63104A Unspecified dislocation of right thumb, initial encounter: Secondary | ICD-10-CM | POA: Diagnosis not present

## 2018-04-13 DIAGNOSIS — M25511 Pain in right shoulder: Secondary | ICD-10-CM | POA: Diagnosis not present

## 2018-05-14 DIAGNOSIS — E782 Mixed hyperlipidemia: Secondary | ICD-10-CM | POA: Diagnosis not present

## 2018-05-14 DIAGNOSIS — K210 Gastro-esophageal reflux disease with esophagitis: Secondary | ICD-10-CM | POA: Diagnosis not present

## 2018-05-14 DIAGNOSIS — R5383 Other fatigue: Secondary | ICD-10-CM | POA: Diagnosis not present

## 2018-05-14 DIAGNOSIS — R5382 Chronic fatigue, unspecified: Secondary | ICD-10-CM | POA: Diagnosis not present

## 2018-05-14 DIAGNOSIS — R946 Abnormal results of thyroid function studies: Secondary | ICD-10-CM | POA: Diagnosis not present

## 2018-05-16 DIAGNOSIS — Z1212 Encounter for screening for malignant neoplasm of rectum: Secondary | ICD-10-CM | POA: Diagnosis not present

## 2018-05-16 DIAGNOSIS — Z6830 Body mass index (BMI) 30.0-30.9, adult: Secondary | ICD-10-CM | POA: Diagnosis not present

## 2018-05-16 DIAGNOSIS — Z0001 Encounter for general adult medical examination with abnormal findings: Secondary | ICD-10-CM | POA: Diagnosis not present

## 2018-06-14 DIAGNOSIS — E785 Hyperlipidemia, unspecified: Secondary | ICD-10-CM | POA: Diagnosis not present

## 2018-06-14 DIAGNOSIS — F325 Major depressive disorder, single episode, in full remission: Secondary | ICD-10-CM | POA: Diagnosis not present

## 2018-06-14 DIAGNOSIS — I10 Essential (primary) hypertension: Secondary | ICD-10-CM | POA: Diagnosis not present

## 2018-06-14 DIAGNOSIS — G250 Essential tremor: Secondary | ICD-10-CM | POA: Diagnosis not present

## 2018-06-14 DIAGNOSIS — Z0000 Encounter for general adult medical examination without abnormal findings: Secondary | ICD-10-CM | POA: Diagnosis not present

## 2018-06-14 DIAGNOSIS — I341 Nonrheumatic mitral (valve) prolapse: Secondary | ICD-10-CM | POA: Diagnosis not present

## 2018-06-14 DIAGNOSIS — K219 Gastro-esophageal reflux disease without esophagitis: Secondary | ICD-10-CM | POA: Diagnosis not present

## 2018-06-28 DIAGNOSIS — L821 Other seborrheic keratosis: Secondary | ICD-10-CM | POA: Diagnosis not present

## 2018-06-28 DIAGNOSIS — Z85828 Personal history of other malignant neoplasm of skin: Secondary | ICD-10-CM | POA: Diagnosis not present

## 2018-07-16 DIAGNOSIS — M9902 Segmental and somatic dysfunction of thoracic region: Secondary | ICD-10-CM | POA: Diagnosis not present

## 2018-07-16 DIAGNOSIS — M542 Cervicalgia: Secondary | ICD-10-CM | POA: Diagnosis not present

## 2018-07-16 DIAGNOSIS — M9903 Segmental and somatic dysfunction of lumbar region: Secondary | ICD-10-CM | POA: Diagnosis not present

## 2018-07-16 DIAGNOSIS — S338XXA Sprain of other parts of lumbar spine and pelvis, initial encounter: Secondary | ICD-10-CM | POA: Diagnosis not present

## 2018-07-16 DIAGNOSIS — S233XXA Sprain of ligaments of thoracic spine, initial encounter: Secondary | ICD-10-CM | POA: Diagnosis not present

## 2018-07-16 DIAGNOSIS — M9901 Segmental and somatic dysfunction of cervical region: Secondary | ICD-10-CM | POA: Diagnosis not present

## 2018-07-16 DIAGNOSIS — M47812 Spondylosis without myelopathy or radiculopathy, cervical region: Secondary | ICD-10-CM | POA: Diagnosis not present

## 2018-07-19 DIAGNOSIS — M542 Cervicalgia: Secondary | ICD-10-CM | POA: Diagnosis not present

## 2018-07-19 DIAGNOSIS — M47812 Spondylosis without myelopathy or radiculopathy, cervical region: Secondary | ICD-10-CM | POA: Diagnosis not present

## 2018-07-19 DIAGNOSIS — M9902 Segmental and somatic dysfunction of thoracic region: Secondary | ICD-10-CM | POA: Diagnosis not present

## 2018-07-19 DIAGNOSIS — S233XXA Sprain of ligaments of thoracic spine, initial encounter: Secondary | ICD-10-CM | POA: Diagnosis not present

## 2018-07-19 DIAGNOSIS — M9903 Segmental and somatic dysfunction of lumbar region: Secondary | ICD-10-CM | POA: Diagnosis not present

## 2018-07-19 DIAGNOSIS — S338XXA Sprain of other parts of lumbar spine and pelvis, initial encounter: Secondary | ICD-10-CM | POA: Diagnosis not present

## 2018-07-19 DIAGNOSIS — M9901 Segmental and somatic dysfunction of cervical region: Secondary | ICD-10-CM | POA: Diagnosis not present

## 2018-07-20 DIAGNOSIS — M25511 Pain in right shoulder: Secondary | ICD-10-CM | POA: Diagnosis not present

## 2018-07-23 DIAGNOSIS — E785 Hyperlipidemia, unspecified: Secondary | ICD-10-CM | POA: Diagnosis not present

## 2018-08-27 DIAGNOSIS — M1711 Unilateral primary osteoarthritis, right knee: Secondary | ICD-10-CM | POA: Diagnosis not present

## 2018-08-27 DIAGNOSIS — F325 Major depressive disorder, single episode, in full remission: Secondary | ICD-10-CM | POA: Diagnosis not present

## 2018-08-27 DIAGNOSIS — E785 Hyperlipidemia, unspecified: Secondary | ICD-10-CM | POA: Diagnosis not present

## 2018-09-06 DIAGNOSIS — R946 Abnormal results of thyroid function studies: Secondary | ICD-10-CM | POA: Diagnosis not present

## 2018-09-06 DIAGNOSIS — R5382 Chronic fatigue, unspecified: Secondary | ICD-10-CM | POA: Diagnosis not present

## 2018-09-06 DIAGNOSIS — K210 Gastro-esophageal reflux disease with esophagitis: Secondary | ICD-10-CM | POA: Diagnosis not present

## 2018-09-06 DIAGNOSIS — E782 Mixed hyperlipidemia: Secondary | ICD-10-CM | POA: Diagnosis not present

## 2018-09-11 DIAGNOSIS — K219 Gastro-esophageal reflux disease without esophagitis: Secondary | ICD-10-CM | POA: Diagnosis not present

## 2018-09-11 DIAGNOSIS — J301 Allergic rhinitis due to pollen: Secondary | ICD-10-CM | POA: Diagnosis not present

## 2018-09-11 DIAGNOSIS — Z6831 Body mass index (BMI) 31.0-31.9, adult: Secondary | ICD-10-CM | POA: Diagnosis not present

## 2018-09-11 DIAGNOSIS — G4700 Insomnia, unspecified: Secondary | ICD-10-CM | POA: Diagnosis not present

## 2018-09-11 DIAGNOSIS — E782 Mixed hyperlipidemia: Secondary | ICD-10-CM | POA: Diagnosis not present

## 2018-09-11 DIAGNOSIS — R5382 Chronic fatigue, unspecified: Secondary | ICD-10-CM | POA: Diagnosis not present

## 2018-09-11 DIAGNOSIS — E6609 Other obesity due to excess calories: Secondary | ICD-10-CM | POA: Diagnosis not present

## 2018-09-11 DIAGNOSIS — F331 Major depressive disorder, recurrent, moderate: Secondary | ICD-10-CM | POA: Diagnosis not present

## 2018-10-17 DIAGNOSIS — M19011 Primary osteoarthritis, right shoulder: Secondary | ICD-10-CM | POA: Diagnosis not present

## 2018-10-17 DIAGNOSIS — E785 Hyperlipidemia, unspecified: Secondary | ICD-10-CM | POA: Diagnosis not present

## 2018-11-01 DIAGNOSIS — M25511 Pain in right shoulder: Secondary | ICD-10-CM | POA: Diagnosis not present

## 2018-11-01 DIAGNOSIS — M47812 Spondylosis without myelopathy or radiculopathy, cervical region: Secondary | ICD-10-CM | POA: Diagnosis not present

## 2018-11-01 DIAGNOSIS — Z6831 Body mass index (BMI) 31.0-31.9, adult: Secondary | ICD-10-CM | POA: Diagnosis not present

## 2018-11-07 DIAGNOSIS — M5021 Other cervical disc displacement,  high cervical region: Secondary | ICD-10-CM | POA: Diagnosis not present

## 2018-11-07 DIAGNOSIS — M542 Cervicalgia: Secondary | ICD-10-CM | POA: Diagnosis not present

## 2018-11-07 DIAGNOSIS — M47812 Spondylosis without myelopathy or radiculopathy, cervical region: Secondary | ICD-10-CM | POA: Diagnosis not present

## 2018-11-07 DIAGNOSIS — M4802 Spinal stenosis, cervical region: Secondary | ICD-10-CM | POA: Diagnosis not present

## 2018-11-07 DIAGNOSIS — M4302 Spondylolysis, cervical region: Secondary | ICD-10-CM | POA: Diagnosis not present

## 2018-12-31 DIAGNOSIS — L570 Actinic keratosis: Secondary | ICD-10-CM | POA: Diagnosis not present

## 2018-12-31 DIAGNOSIS — S63602A Unspecified sprain of left thumb, initial encounter: Secondary | ICD-10-CM | POA: Diagnosis not present

## 2018-12-31 DIAGNOSIS — L821 Other seborrheic keratosis: Secondary | ICD-10-CM | POA: Diagnosis not present

## 2018-12-31 DIAGNOSIS — D225 Melanocytic nevi of trunk: Secondary | ICD-10-CM | POA: Diagnosis not present

## 2018-12-31 DIAGNOSIS — Z85828 Personal history of other malignant neoplasm of skin: Secondary | ICD-10-CM | POA: Diagnosis not present

## 2018-12-31 DIAGNOSIS — D2271 Melanocytic nevi of right lower limb, including hip: Secondary | ICD-10-CM | POA: Diagnosis not present

## 2018-12-31 DIAGNOSIS — Z6830 Body mass index (BMI) 30.0-30.9, adult: Secondary | ICD-10-CM | POA: Diagnosis not present

## 2018-12-31 DIAGNOSIS — D1801 Hemangioma of skin and subcutaneous tissue: Secondary | ICD-10-CM | POA: Diagnosis not present

## 2019-01-16 DIAGNOSIS — Z1231 Encounter for screening mammogram for malignant neoplasm of breast: Secondary | ICD-10-CM | POA: Diagnosis not present

## 2019-01-17 DIAGNOSIS — Z23 Encounter for immunization: Secondary | ICD-10-CM | POA: Diagnosis not present

## 2019-01-17 DIAGNOSIS — Z6830 Body mass index (BMI) 30.0-30.9, adult: Secondary | ICD-10-CM | POA: Diagnosis not present

## 2019-01-17 DIAGNOSIS — S62502A Fracture of unspecified phalanx of left thumb, initial encounter for closed fracture: Secondary | ICD-10-CM | POA: Diagnosis not present

## 2019-01-17 DIAGNOSIS — S63602A Unspecified sprain of left thumb, initial encounter: Secondary | ICD-10-CM | POA: Diagnosis not present

## 2019-01-29 DIAGNOSIS — Z6830 Body mass index (BMI) 30.0-30.9, adult: Secondary | ICD-10-CM | POA: Diagnosis not present

## 2019-01-29 DIAGNOSIS — S62502A Fracture of unspecified phalanx of left thumb, initial encounter for closed fracture: Secondary | ICD-10-CM | POA: Diagnosis not present

## 2019-01-29 DIAGNOSIS — J309 Allergic rhinitis, unspecified: Secondary | ICD-10-CM | POA: Diagnosis not present

## 2019-01-29 DIAGNOSIS — S63602A Unspecified sprain of left thumb, initial encounter: Secondary | ICD-10-CM | POA: Diagnosis not present

## 2019-02-15 DIAGNOSIS — J209 Acute bronchitis, unspecified: Secondary | ICD-10-CM | POA: Diagnosis not present

## 2019-04-08 DIAGNOSIS — E782 Mixed hyperlipidemia: Secondary | ICD-10-CM | POA: Diagnosis not present

## 2019-04-08 DIAGNOSIS — K219 Gastro-esophageal reflux disease without esophagitis: Secondary | ICD-10-CM | POA: Diagnosis not present

## 2019-04-17 ENCOUNTER — Other Ambulatory Visit: Payer: Self-pay | Admitting: Orthopedic Surgery

## 2019-04-17 DIAGNOSIS — S63642A Sprain of metacarpophalangeal joint of left thumb, initial encounter: Secondary | ICD-10-CM

## 2019-04-17 DIAGNOSIS — S5332XA Traumatic rupture of left ulnar collateral ligament, initial encounter: Secondary | ICD-10-CM

## 2019-05-17 DIAGNOSIS — Z9189 Other specified personal risk factors, not elsewhere classified: Secondary | ICD-10-CM | POA: Diagnosis not present

## 2019-05-17 DIAGNOSIS — R946 Abnormal results of thyroid function studies: Secondary | ICD-10-CM | POA: Diagnosis not present

## 2019-05-17 DIAGNOSIS — K219 Gastro-esophageal reflux disease without esophagitis: Secondary | ICD-10-CM | POA: Diagnosis not present

## 2019-05-17 DIAGNOSIS — R5382 Chronic fatigue, unspecified: Secondary | ICD-10-CM | POA: Diagnosis not present

## 2019-05-17 DIAGNOSIS — E782 Mixed hyperlipidemia: Secondary | ICD-10-CM | POA: Diagnosis not present

## 2019-05-17 DIAGNOSIS — R5383 Other fatigue: Secondary | ICD-10-CM | POA: Diagnosis not present

## 2019-05-17 DIAGNOSIS — F331 Major depressive disorder, recurrent, moderate: Secondary | ICD-10-CM | POA: Diagnosis not present

## 2019-05-21 ENCOUNTER — Other Ambulatory Visit: Payer: Self-pay

## 2019-05-21 ENCOUNTER — Ambulatory Visit
Admission: RE | Admit: 2019-05-21 | Discharge: 2019-05-21 | Disposition: A | Discharge status: Home / Self Care | Payer: Medicare Other | Source: Ambulatory Visit | Attending: Orthopedic Surgery | Admitting: Orthopedic Surgery

## 2019-05-21 DIAGNOSIS — S63682A Other sprain of left thumb, initial encounter: Secondary | ICD-10-CM | POA: Diagnosis not present

## 2019-05-21 DIAGNOSIS — S5332XA Traumatic rupture of left ulnar collateral ligament, initial encounter: Secondary | ICD-10-CM | POA: Diagnosis not present

## 2019-05-21 DIAGNOSIS — S63642A Sprain of metacarpophalangeal joint of left thumb, initial encounter: Secondary | ICD-10-CM

## 2019-05-21 DIAGNOSIS — M79645 Pain in left finger(s): Secondary | ICD-10-CM | POA: Diagnosis not present

## 2019-05-21 MED ORDER — IOPAMIDOL (ISOVUE-M 200) INJECTION 41%
1.5000 mL | Freq: Once | INTRAMUSCULAR | Status: AC
  Administered 2019-05-21: 1.5 mL via INTRA_ARTICULAR

## 2019-05-29 DIAGNOSIS — S66812A Strain of other specified muscles, fascia and tendons at wrist and hand level, left hand, initial encounter: Secondary | ICD-10-CM | POA: Diagnosis not present

## 2019-05-29 DIAGNOSIS — S5332XA Traumatic rupture of left ulnar collateral ligament, initial encounter: Secondary | ICD-10-CM | POA: Diagnosis not present

## 2019-05-31 DIAGNOSIS — Z23 Encounter for immunization: Secondary | ICD-10-CM | POA: Diagnosis not present

## 2019-06-13 DIAGNOSIS — I10 Essential (primary) hypertension: Secondary | ICD-10-CM | POA: Diagnosis not present

## 2019-06-13 DIAGNOSIS — E785 Hyperlipidemia, unspecified: Secondary | ICD-10-CM | POA: Diagnosis not present

## 2019-06-19 DIAGNOSIS — S5332XA Traumatic rupture of left ulnar collateral ligament, initial encounter: Secondary | ICD-10-CM | POA: Diagnosis not present

## 2019-06-20 DIAGNOSIS — K219 Gastro-esophageal reflux disease without esophagitis: Secondary | ICD-10-CM | POA: Diagnosis not present

## 2019-06-20 DIAGNOSIS — G250 Essential tremor: Secondary | ICD-10-CM | POA: Diagnosis not present

## 2019-06-20 DIAGNOSIS — Z0000 Encounter for general adult medical examination without abnormal findings: Secondary | ICD-10-CM | POA: Diagnosis not present

## 2019-06-20 DIAGNOSIS — F325 Major depressive disorder, single episode, in full remission: Secondary | ICD-10-CM | POA: Diagnosis not present

## 2019-06-20 DIAGNOSIS — I10 Essential (primary) hypertension: Secondary | ICD-10-CM | POA: Diagnosis not present

## 2019-06-20 DIAGNOSIS — S5332XD Traumatic rupture of left ulnar collateral ligament, subsequent encounter: Secondary | ICD-10-CM | POA: Diagnosis not present

## 2019-06-20 DIAGNOSIS — I341 Nonrheumatic mitral (valve) prolapse: Secondary | ICD-10-CM | POA: Diagnosis not present

## 2019-06-20 DIAGNOSIS — E785 Hyperlipidemia, unspecified: Secondary | ICD-10-CM | POA: Diagnosis not present

## 2019-07-01 DIAGNOSIS — Z23 Encounter for immunization: Secondary | ICD-10-CM | POA: Diagnosis not present

## 2019-07-04 DIAGNOSIS — M9902 Segmental and somatic dysfunction of thoracic region: Secondary | ICD-10-CM | POA: Diagnosis not present

## 2019-07-04 DIAGNOSIS — M9903 Segmental and somatic dysfunction of lumbar region: Secondary | ICD-10-CM | POA: Diagnosis not present

## 2019-07-04 DIAGNOSIS — M9901 Segmental and somatic dysfunction of cervical region: Secondary | ICD-10-CM | POA: Diagnosis not present

## 2019-07-06 ENCOUNTER — Ambulatory Visit: Payer: No Typology Code available for payment source

## 2019-07-09 DIAGNOSIS — M9902 Segmental and somatic dysfunction of thoracic region: Secondary | ICD-10-CM | POA: Diagnosis not present

## 2019-07-09 DIAGNOSIS — M9901 Segmental and somatic dysfunction of cervical region: Secondary | ICD-10-CM | POA: Diagnosis not present

## 2019-07-09 DIAGNOSIS — M9903 Segmental and somatic dysfunction of lumbar region: Secondary | ICD-10-CM | POA: Diagnosis not present

## 2019-07-11 DIAGNOSIS — M9901 Segmental and somatic dysfunction of cervical region: Secondary | ICD-10-CM | POA: Diagnosis not present

## 2019-07-11 DIAGNOSIS — M9903 Segmental and somatic dysfunction of lumbar region: Secondary | ICD-10-CM | POA: Diagnosis not present

## 2019-07-11 DIAGNOSIS — M9902 Segmental and somatic dysfunction of thoracic region: Secondary | ICD-10-CM | POA: Diagnosis not present

## 2019-07-16 DIAGNOSIS — M9902 Segmental and somatic dysfunction of thoracic region: Secondary | ICD-10-CM | POA: Diagnosis not present

## 2019-07-16 DIAGNOSIS — M9901 Segmental and somatic dysfunction of cervical region: Secondary | ICD-10-CM | POA: Diagnosis not present

## 2019-07-16 DIAGNOSIS — M9903 Segmental and somatic dysfunction of lumbar region: Secondary | ICD-10-CM | POA: Diagnosis not present

## 2019-07-18 DIAGNOSIS — M9901 Segmental and somatic dysfunction of cervical region: Secondary | ICD-10-CM | POA: Diagnosis not present

## 2019-07-18 DIAGNOSIS — M9902 Segmental and somatic dysfunction of thoracic region: Secondary | ICD-10-CM | POA: Diagnosis not present

## 2019-07-18 DIAGNOSIS — M9903 Segmental and somatic dysfunction of lumbar region: Secondary | ICD-10-CM | POA: Diagnosis not present

## 2019-07-23 DIAGNOSIS — M9901 Segmental and somatic dysfunction of cervical region: Secondary | ICD-10-CM | POA: Diagnosis not present

## 2019-07-23 DIAGNOSIS — M9903 Segmental and somatic dysfunction of lumbar region: Secondary | ICD-10-CM | POA: Diagnosis not present

## 2019-07-23 DIAGNOSIS — M9902 Segmental and somatic dysfunction of thoracic region: Secondary | ICD-10-CM | POA: Diagnosis not present

## 2019-07-26 DIAGNOSIS — E782 Mixed hyperlipidemia: Secondary | ICD-10-CM | POA: Diagnosis not present

## 2019-07-26 DIAGNOSIS — K219 Gastro-esophageal reflux disease without esophagitis: Secondary | ICD-10-CM | POA: Diagnosis not present

## 2019-07-26 DIAGNOSIS — K2100 Gastro-esophageal reflux disease with esophagitis, without bleeding: Secondary | ICD-10-CM | POA: Diagnosis not present

## 2019-07-26 DIAGNOSIS — R946 Abnormal results of thyroid function studies: Secondary | ICD-10-CM | POA: Diagnosis not present

## 2019-08-01 DIAGNOSIS — S5332XD Traumatic rupture of left ulnar collateral ligament, subsequent encounter: Secondary | ICD-10-CM | POA: Diagnosis not present

## 2019-08-01 DIAGNOSIS — G8918 Other acute postprocedural pain: Secondary | ICD-10-CM | POA: Diagnosis not present

## 2019-08-01 DIAGNOSIS — S5332XA Traumatic rupture of left ulnar collateral ligament, initial encounter: Secondary | ICD-10-CM | POA: Diagnosis not present

## 2019-08-07 DIAGNOSIS — E7849 Other hyperlipidemia: Secondary | ICD-10-CM | POA: Diagnosis not present

## 2019-08-07 DIAGNOSIS — I10 Essential (primary) hypertension: Secondary | ICD-10-CM | POA: Diagnosis not present

## 2019-09-06 DIAGNOSIS — K2100 Gastro-esophageal reflux disease with esophagitis, without bleeding: Secondary | ICD-10-CM | POA: Diagnosis not present

## 2019-09-06 DIAGNOSIS — E7849 Other hyperlipidemia: Secondary | ICD-10-CM | POA: Diagnosis not present

## 2019-09-12 DIAGNOSIS — L821 Other seborrheic keratosis: Secondary | ICD-10-CM | POA: Diagnosis not present

## 2019-09-12 DIAGNOSIS — D1801 Hemangioma of skin and subcutaneous tissue: Secondary | ICD-10-CM | POA: Diagnosis not present

## 2019-09-12 DIAGNOSIS — D2261 Melanocytic nevi of right upper limb, including shoulder: Secondary | ICD-10-CM | POA: Diagnosis not present

## 2019-09-12 DIAGNOSIS — D2271 Melanocytic nevi of right lower limb, including hip: Secondary | ICD-10-CM | POA: Diagnosis not present

## 2019-09-12 DIAGNOSIS — Z85828 Personal history of other malignant neoplasm of skin: Secondary | ICD-10-CM | POA: Diagnosis not present

## 2019-09-12 DIAGNOSIS — L814 Other melanin hyperpigmentation: Secondary | ICD-10-CM | POA: Diagnosis not present

## 2019-09-12 DIAGNOSIS — D225 Melanocytic nevi of trunk: Secondary | ICD-10-CM | POA: Diagnosis not present

## 2019-09-18 DIAGNOSIS — Z9889 Other specified postprocedural states: Secondary | ICD-10-CM | POA: Diagnosis not present

## 2019-09-23 DIAGNOSIS — S5332XA Traumatic rupture of left ulnar collateral ligament, initial encounter: Secondary | ICD-10-CM | POA: Diagnosis not present

## 2019-09-23 DIAGNOSIS — R29898 Other symptoms and signs involving the musculoskeletal system: Secondary | ICD-10-CM | POA: Diagnosis not present

## 2019-09-27 DIAGNOSIS — R29898 Other symptoms and signs involving the musculoskeletal system: Secondary | ICD-10-CM | POA: Diagnosis not present

## 2019-09-27 DIAGNOSIS — S5332XA Traumatic rupture of left ulnar collateral ligament, initial encounter: Secondary | ICD-10-CM | POA: Diagnosis not present

## 2019-10-01 DIAGNOSIS — S5332XA Traumatic rupture of left ulnar collateral ligament, initial encounter: Secondary | ICD-10-CM | POA: Diagnosis not present

## 2019-10-01 DIAGNOSIS — R29898 Other symptoms and signs involving the musculoskeletal system: Secondary | ICD-10-CM | POA: Diagnosis not present

## 2019-10-03 DIAGNOSIS — S5332XA Traumatic rupture of left ulnar collateral ligament, initial encounter: Secondary | ICD-10-CM | POA: Diagnosis not present

## 2019-10-03 DIAGNOSIS — R29898 Other symptoms and signs involving the musculoskeletal system: Secondary | ICD-10-CM | POA: Diagnosis not present

## 2019-10-08 DIAGNOSIS — R29898 Other symptoms and signs involving the musculoskeletal system: Secondary | ICD-10-CM | POA: Diagnosis not present

## 2019-10-08 DIAGNOSIS — S5332XA Traumatic rupture of left ulnar collateral ligament, initial encounter: Secondary | ICD-10-CM | POA: Diagnosis not present

## 2019-10-10 DIAGNOSIS — S5332XA Traumatic rupture of left ulnar collateral ligament, initial encounter: Secondary | ICD-10-CM | POA: Diagnosis not present

## 2019-10-10 DIAGNOSIS — R29898 Other symptoms and signs involving the musculoskeletal system: Secondary | ICD-10-CM | POA: Diagnosis not present

## 2019-10-15 DIAGNOSIS — S5332XA Traumatic rupture of left ulnar collateral ligament, initial encounter: Secondary | ICD-10-CM | POA: Diagnosis not present

## 2019-10-15 DIAGNOSIS — R29898 Other symptoms and signs involving the musculoskeletal system: Secondary | ICD-10-CM | POA: Diagnosis not present

## 2019-10-24 DIAGNOSIS — S5332XA Traumatic rupture of left ulnar collateral ligament, initial encounter: Secondary | ICD-10-CM | POA: Diagnosis not present

## 2019-10-24 DIAGNOSIS — Z9889 Other specified postprocedural states: Secondary | ICD-10-CM | POA: Diagnosis not present

## 2019-10-24 DIAGNOSIS — R29898 Other symptoms and signs involving the musculoskeletal system: Secondary | ICD-10-CM | POA: Diagnosis not present

## 2019-11-06 DIAGNOSIS — Z9889 Other specified postprocedural states: Secondary | ICD-10-CM | POA: Diagnosis not present

## 2019-11-06 DIAGNOSIS — R29898 Other symptoms and signs involving the musculoskeletal system: Secondary | ICD-10-CM | POA: Diagnosis not present

## 2019-11-06 DIAGNOSIS — S5332XA Traumatic rupture of left ulnar collateral ligament, initial encounter: Secondary | ICD-10-CM | POA: Diagnosis not present

## 2019-11-08 DIAGNOSIS — Z9889 Other specified postprocedural states: Secondary | ICD-10-CM | POA: Diagnosis not present

## 2019-11-08 DIAGNOSIS — R29898 Other symptoms and signs involving the musculoskeletal system: Secondary | ICD-10-CM | POA: Diagnosis not present

## 2019-11-08 DIAGNOSIS — S5332XA Traumatic rupture of left ulnar collateral ligament, initial encounter: Secondary | ICD-10-CM | POA: Diagnosis not present

## 2019-11-19 DIAGNOSIS — S5332XA Traumatic rupture of left ulnar collateral ligament, initial encounter: Secondary | ICD-10-CM | POA: Diagnosis not present

## 2019-11-19 DIAGNOSIS — R29898 Other symptoms and signs involving the musculoskeletal system: Secondary | ICD-10-CM | POA: Diagnosis not present

## 2019-11-19 DIAGNOSIS — Z9889 Other specified postprocedural states: Secondary | ICD-10-CM | POA: Diagnosis not present

## 2019-12-10 DIAGNOSIS — Z0001 Encounter for general adult medical examination with abnormal findings: Secondary | ICD-10-CM | POA: Diagnosis not present

## 2019-12-10 DIAGNOSIS — K219 Gastro-esophageal reflux disease without esophagitis: Secondary | ICD-10-CM | POA: Diagnosis not present

## 2019-12-10 DIAGNOSIS — Z6829 Body mass index (BMI) 29.0-29.9, adult: Secondary | ICD-10-CM | POA: Diagnosis not present

## 2019-12-10 DIAGNOSIS — R5382 Chronic fatigue, unspecified: Secondary | ICD-10-CM | POA: Diagnosis not present

## 2019-12-10 DIAGNOSIS — Z1331 Encounter for screening for depression: Secondary | ICD-10-CM | POA: Diagnosis not present

## 2019-12-10 DIAGNOSIS — E782 Mixed hyperlipidemia: Secondary | ICD-10-CM | POA: Diagnosis not present

## 2019-12-10 DIAGNOSIS — F331 Major depressive disorder, recurrent, moderate: Secondary | ICD-10-CM | POA: Diagnosis not present

## 2019-12-10 DIAGNOSIS — Z1389 Encounter for screening for other disorder: Secondary | ICD-10-CM | POA: Diagnosis not present

## 2019-12-16 DIAGNOSIS — G8929 Other chronic pain: Secondary | ICD-10-CM | POA: Diagnosis not present

## 2019-12-16 DIAGNOSIS — M25511 Pain in right shoulder: Secondary | ICD-10-CM | POA: Diagnosis not present

## 2019-12-18 DIAGNOSIS — G8929 Other chronic pain: Secondary | ICD-10-CM | POA: Diagnosis not present

## 2019-12-18 DIAGNOSIS — M25511 Pain in right shoulder: Secondary | ICD-10-CM | POA: Diagnosis not present

## 2019-12-24 DIAGNOSIS — G8929 Other chronic pain: Secondary | ICD-10-CM | POA: Diagnosis not present

## 2019-12-24 DIAGNOSIS — M25511 Pain in right shoulder: Secondary | ICD-10-CM | POA: Diagnosis not present

## 2019-12-26 DIAGNOSIS — M25511 Pain in right shoulder: Secondary | ICD-10-CM | POA: Diagnosis not present

## 2019-12-26 DIAGNOSIS — G8929 Other chronic pain: Secondary | ICD-10-CM | POA: Diagnosis not present

## 2019-12-31 DIAGNOSIS — G8929 Other chronic pain: Secondary | ICD-10-CM | POA: Diagnosis not present

## 2019-12-31 DIAGNOSIS — M25511 Pain in right shoulder: Secondary | ICD-10-CM | POA: Diagnosis not present

## 2020-01-02 DIAGNOSIS — M25511 Pain in right shoulder: Secondary | ICD-10-CM | POA: Diagnosis not present

## 2020-01-02 DIAGNOSIS — G8929 Other chronic pain: Secondary | ICD-10-CM | POA: Diagnosis not present

## 2020-01-09 DIAGNOSIS — G8929 Other chronic pain: Secondary | ICD-10-CM | POA: Diagnosis not present

## 2020-01-09 DIAGNOSIS — M25511 Pain in right shoulder: Secondary | ICD-10-CM | POA: Diagnosis not present

## 2020-01-21 DIAGNOSIS — M25511 Pain in right shoulder: Secondary | ICD-10-CM | POA: Diagnosis not present

## 2020-01-21 DIAGNOSIS — G8929 Other chronic pain: Secondary | ICD-10-CM | POA: Diagnosis not present

## 2020-01-23 DIAGNOSIS — G8929 Other chronic pain: Secondary | ICD-10-CM | POA: Diagnosis not present

## 2020-01-23 DIAGNOSIS — M25511 Pain in right shoulder: Secondary | ICD-10-CM | POA: Diagnosis not present

## 2020-01-27 DIAGNOSIS — G4700 Insomnia, unspecified: Secondary | ICD-10-CM | POA: Diagnosis not present

## 2020-01-27 DIAGNOSIS — Z6830 Body mass index (BMI) 30.0-30.9, adult: Secondary | ICD-10-CM | POA: Diagnosis not present

## 2020-01-27 DIAGNOSIS — Z23 Encounter for immunization: Secondary | ICD-10-CM | POA: Diagnosis not present

## 2020-01-27 DIAGNOSIS — M47812 Spondylosis without myelopathy or radiculopathy, cervical region: Secondary | ICD-10-CM | POA: Diagnosis not present

## 2020-01-27 DIAGNOSIS — M25511 Pain in right shoulder: Secondary | ICD-10-CM | POA: Diagnosis not present

## 2020-01-27 DIAGNOSIS — F411 Generalized anxiety disorder: Secondary | ICD-10-CM | POA: Diagnosis not present

## 2020-01-28 DIAGNOSIS — M25511 Pain in right shoulder: Secondary | ICD-10-CM | POA: Diagnosis not present

## 2020-01-28 DIAGNOSIS — G8929 Other chronic pain: Secondary | ICD-10-CM | POA: Diagnosis not present

## 2020-02-06 DIAGNOSIS — M25511 Pain in right shoulder: Secondary | ICD-10-CM | POA: Diagnosis not present

## 2020-02-06 DIAGNOSIS — G8929 Other chronic pain: Secondary | ICD-10-CM | POA: Diagnosis not present

## 2020-02-27 DIAGNOSIS — G8929 Other chronic pain: Secondary | ICD-10-CM | POA: Diagnosis not present

## 2020-02-27 DIAGNOSIS — M25511 Pain in right shoulder: Secondary | ICD-10-CM | POA: Diagnosis not present

## 2020-02-27 DIAGNOSIS — Z1231 Encounter for screening mammogram for malignant neoplasm of breast: Secondary | ICD-10-CM | POA: Diagnosis not present

## 2020-03-10 DIAGNOSIS — M25511 Pain in right shoulder: Secondary | ICD-10-CM | POA: Diagnosis not present

## 2020-03-10 DIAGNOSIS — M67813 Other specified disorders of tendon, right shoulder: Secondary | ICD-10-CM | POA: Diagnosis not present

## 2020-03-14 DIAGNOSIS — Z23 Encounter for immunization: Secondary | ICD-10-CM | POA: Diagnosis not present

## 2020-03-16 DIAGNOSIS — L821 Other seborrheic keratosis: Secondary | ICD-10-CM | POA: Diagnosis not present

## 2020-03-16 DIAGNOSIS — D2272 Melanocytic nevi of left lower limb, including hip: Secondary | ICD-10-CM | POA: Diagnosis not present

## 2020-03-16 DIAGNOSIS — D1801 Hemangioma of skin and subcutaneous tissue: Secondary | ICD-10-CM | POA: Diagnosis not present

## 2020-03-16 DIAGNOSIS — D225 Melanocytic nevi of trunk: Secondary | ICD-10-CM | POA: Diagnosis not present

## 2020-03-16 DIAGNOSIS — Z85828 Personal history of other malignant neoplasm of skin: Secondary | ICD-10-CM | POA: Diagnosis not present

## 2020-03-16 DIAGNOSIS — D2271 Melanocytic nevi of right lower limb, including hip: Secondary | ICD-10-CM | POA: Diagnosis not present

## 2020-04-13 DIAGNOSIS — G4700 Insomnia, unspecified: Secondary | ICD-10-CM | POA: Diagnosis not present

## 2020-04-13 DIAGNOSIS — J301 Allergic rhinitis due to pollen: Secondary | ICD-10-CM | POA: Diagnosis not present

## 2020-04-13 DIAGNOSIS — R4582 Worries: Secondary | ICD-10-CM | POA: Diagnosis not present

## 2020-04-13 DIAGNOSIS — Z6830 Body mass index (BMI) 30.0-30.9, adult: Secondary | ICD-10-CM | POA: Diagnosis not present

## 2020-04-13 DIAGNOSIS — E6609 Other obesity due to excess calories: Secondary | ICD-10-CM | POA: Diagnosis not present

## 2020-04-13 DIAGNOSIS — F331 Major depressive disorder, recurrent, moderate: Secondary | ICD-10-CM | POA: Diagnosis not present

## 2020-04-13 DIAGNOSIS — R5382 Chronic fatigue, unspecified: Secondary | ICD-10-CM | POA: Diagnosis not present

## 2020-04-13 DIAGNOSIS — E782 Mixed hyperlipidemia: Secondary | ICD-10-CM | POA: Diagnosis not present

## 2020-04-13 DIAGNOSIS — K2100 Gastro-esophageal reflux disease with esophagitis, without bleeding: Secondary | ICD-10-CM | POA: Diagnosis not present

## 2020-04-13 DIAGNOSIS — K219 Gastro-esophageal reflux disease without esophagitis: Secondary | ICD-10-CM | POA: Diagnosis not present

## 2020-04-13 DIAGNOSIS — Z1212 Encounter for screening for malignant neoplasm of rectum: Secondary | ICD-10-CM | POA: Diagnosis not present

## 2020-08-06 DIAGNOSIS — R5382 Chronic fatigue, unspecified: Secondary | ICD-10-CM | POA: Diagnosis not present

## 2020-08-06 DIAGNOSIS — E782 Mixed hyperlipidemia: Secondary | ICD-10-CM | POA: Diagnosis not present

## 2020-08-06 DIAGNOSIS — K2100 Gastro-esophageal reflux disease with esophagitis, without bleeding: Secondary | ICD-10-CM | POA: Diagnosis not present

## 2020-08-06 DIAGNOSIS — E7849 Other hyperlipidemia: Secondary | ICD-10-CM | POA: Diagnosis not present

## 2020-08-10 DIAGNOSIS — R5382 Chronic fatigue, unspecified: Secondary | ICD-10-CM | POA: Diagnosis not present

## 2020-08-10 DIAGNOSIS — Z7189 Other specified counseling: Secondary | ICD-10-CM | POA: Diagnosis not present

## 2020-08-10 DIAGNOSIS — J301 Allergic rhinitis due to pollen: Secondary | ICD-10-CM | POA: Diagnosis not present

## 2020-08-10 DIAGNOSIS — K2100 Gastro-esophageal reflux disease with esophagitis, without bleeding: Secondary | ICD-10-CM | POA: Diagnosis not present

## 2020-08-10 DIAGNOSIS — R4582 Worries: Secondary | ICD-10-CM | POA: Diagnosis not present

## 2020-08-10 DIAGNOSIS — E7849 Other hyperlipidemia: Secondary | ICD-10-CM | POA: Diagnosis not present

## 2020-08-10 DIAGNOSIS — G4700 Insomnia, unspecified: Secondary | ICD-10-CM | POA: Diagnosis not present

## 2020-09-30 DIAGNOSIS — L821 Other seborrheic keratosis: Secondary | ICD-10-CM | POA: Diagnosis not present

## 2020-09-30 DIAGNOSIS — D2271 Melanocytic nevi of right lower limb, including hip: Secondary | ICD-10-CM | POA: Diagnosis not present

## 2020-09-30 DIAGNOSIS — Z85828 Personal history of other malignant neoplasm of skin: Secondary | ICD-10-CM | POA: Diagnosis not present

## 2020-09-30 DIAGNOSIS — D2272 Melanocytic nevi of left lower limb, including hip: Secondary | ICD-10-CM | POA: Diagnosis not present

## 2020-09-30 DIAGNOSIS — D1801 Hemangioma of skin and subcutaneous tissue: Secondary | ICD-10-CM | POA: Diagnosis not present

## 2020-09-30 DIAGNOSIS — L820 Inflamed seborrheic keratosis: Secondary | ICD-10-CM | POA: Diagnosis not present

## 2020-09-30 DIAGNOSIS — D225 Melanocytic nevi of trunk: Secondary | ICD-10-CM | POA: Diagnosis not present

## 2020-09-30 DIAGNOSIS — L309 Dermatitis, unspecified: Secondary | ICD-10-CM | POA: Diagnosis not present

## 2020-09-30 DIAGNOSIS — L570 Actinic keratosis: Secondary | ICD-10-CM | POA: Diagnosis not present

## 2020-12-14 DIAGNOSIS — M25532 Pain in left wrist: Secondary | ICD-10-CM | POA: Diagnosis not present

## 2020-12-14 DIAGNOSIS — S52592D Other fractures of lower end of left radius, subsequent encounter for closed fracture with routine healing: Secondary | ICD-10-CM | POA: Diagnosis not present

## 2020-12-14 DIAGNOSIS — S93402A Sprain of unspecified ligament of left ankle, initial encounter: Secondary | ICD-10-CM | POA: Diagnosis not present

## 2020-12-14 DIAGNOSIS — S82892A Other fracture of left lower leg, initial encounter for closed fracture: Secondary | ICD-10-CM | POA: Diagnosis not present

## 2020-12-14 DIAGNOSIS — S82842D Displaced bimalleolar fracture of left lower leg, subsequent encounter for closed fracture with routine healing: Secondary | ICD-10-CM | POA: Diagnosis not present

## 2020-12-15 ENCOUNTER — Encounter (HOSPITAL_COMMUNITY): Payer: Self-pay

## 2020-12-15 ENCOUNTER — Other Ambulatory Visit: Payer: Self-pay

## 2020-12-15 ENCOUNTER — Other Ambulatory Visit: Payer: Self-pay | Admitting: Orthopaedic Surgery

## 2020-12-15 DIAGNOSIS — S52502D Unspecified fracture of the lower end of left radius, subsequent encounter for closed fracture with routine healing: Secondary | ICD-10-CM | POA: Diagnosis not present

## 2020-12-15 NOTE — Progress Notes (Addendum)
COVID Vaccine Completed: Yes Date COVID Vaccine completed: x2 Has received booster:N/A COVID vaccine manufacturer:  Moderna   Date of COVID positive in last 90 days: No  PCP - Caryl Bis, MD [place] Cardiologist - N/A  Chest x-ray - N/A EKG - N/A Stress Test - N/A ECHO - N/A Cardiac Cath - N/A Pacemaker/ICD device last checked:N/A  Sleep Study - N/A CPAP - N/A  Fasting Blood Sugar - N/A Checks Blood Sugar ___N/A__ times a day  Blood Thinner Instructions: N/A Aspirin Instructions: N/A Last Dose: N/A  Activity level:  Can go up a flight of stairs and activities of daily living without stopping and without symptoms     Anesthesia review:  N/A  Patient denies shortness of breath, fever, cough and chest pain at PAT appointment   Patient verbalized understanding of instructions that were given to them at the PAT appointment. Patient was also instructed that they will need to review over the PAT instructions again at home before surgery.

## 2020-12-15 NOTE — H&P (Signed)
PREOPERATIVE H&P  Chief Complaint: LEFT WRIST FRACTURE AND LEFT ANKLE FRACTURE  HPI: Dominique Brooks is a 70 y.o. female who presents for preoperative history and physical prior to scheduled surgery, Procedure(s): OPEN REDUCTION INTERNAL FIXATION (ORIF) DISTAL RADIAL FRACTURE OPEN REDUCTION INTERNAL FIXATION (ORIF) ANKLE FRACTURE.   Patient has a past medical history significant for GERD, functional heart murmur.   The patient was referred by Dr. Layne Benton.  She unfortunately fell down the stairs and had a bimalleolar ankle fracture dislocation and left wrist fracture on 12/11/2020.    Her symptoms are rated as moderate to severe, and have been worsening.  This is significantly impairing activities of daily living.    Please see clinic note for further details on this patient's care.    She has elected for surgical management.   Past Medical History:  Diagnosis Date   Arthritis    Constipation 04/28/2016   GERD (gastroesophageal reflux disease)    Heart murmur    Functional murmur since birth   Rotator cuff tear    Right   Skin cancer    Basal cell   Past Surgical History:  Procedure Laterality Date   ABDOMINAL HYSTERECTOMY     BLADDER SUSPENSION     BUNIONECTOMY     x 2   COLONOSCOPY N/A 06/22/2016   Procedure: COLONOSCOPY;  Surgeon: Rogene Houston, MD;  Location: [facility];  Service: Endoscopy;  Laterality: N/A;  1:00   FOOT SURGERY Right    POLYPECTOMY  06/22/2016   Procedure: POLYPECTOMY;  Surgeon: Rogene Houston, MD;  Location: [facility];  Service: Endoscopy;;  colon   Social History   Socioeconomic History   Marital status: Divorced    Spouse name: Not on file   Number of children: Not on file   Years of education: Not on file   Highest education level: Not on file  Occupational History   Not on file  Tobacco Use   Smoking status: Never   Smokeless tobacco: Never  Vaping Use   Vaping Use: Never used  Substance and Sexual Activity   Alcohol  use: No   Drug use: No   Sexual activity: Not on file  Other Topics Concern   Not on file  Social History Narrative   Not on file   Social Determinants of Health   Financial Resource Strain: Not on file  Food Insecurity: Not on file  Transportation Needs: Not on file  Physical Activity: Not on file  Stress: Not on file  Social Connections: Not on file   History reviewed. No pertinent family history. Allergies  Allergen Reactions   Sulfa Antibiotics     Flushing, red   Prior to Admission medications   Medication Sig Start Date End Date Taking? Authorizing Provider  ALPRAZolam Duanne Moron) 0.5 MG tablet Take 0.25-0.5 mg by mouth 2 (two) times daily as needed (muscle spasms).    [provider]  citalopram (CELEXA) 40 MG tablet Take 20 mg by mouth daily.    [provider]  fexofenadine (ALLEGRA) 180 MG tablet Take 180 mg by mouth daily.    [provider]  Multiple Vitamins-Minerals (MULTIVITAMIN WITH MINERALS) tablet Take 1 tablet by mouth daily.    [provider]  omeprazole (PRILOSEC) 20 MG capsule  07/09/20   [provider]  pantoprazole (PROTONIX) 40 MG tablet Take 40 mg by mouth daily.    [provider]    ROS: All other systems have been reviewed  and were otherwise negative with the exception of those mentioned in the HPI and as above.  Physical Exam: General: Alert, no acute distress Cardiovascular: No pedal edema Respiratory: No cyanosis, no use of accessory musculature GI: No organomegaly, abdomen is soft and non-tender Skin: No lesions in the area of chief complaint Neurologic: Sensation intact distally Psychiatric: Patient is competent for consent with normal mood and affect Lymphatic: No axillary or cervical lymphadenopathy  MUSCULOSKELETAL:  Limited exam secondary to splint on the left ankle.  Unable to check the skin.  Distal motor and sensory function is intact.  No obvious tenderness to palpation or pain  around the knee.    Left wrist demonstrates pain around the distal radius with dorsal boggy swelling.    Imaging: X-rays of the ankle from [facility] are reviewed in [place] and demonstrate bimalleolar ankle fracture/dislocation with lateral subluxation of the talus, comminuted fracture of the fibula noted.  No obvious posterior malleolus fracture.    Left wrist x-rays were reviewed, three views done today, demonstrated a small non-displaced fracture of the distal radius.    Assessment: LEFT WRIST FRACTURE AND LEFT ANKLE FRACTURE  Plan: Plan for Procedure(s): OPEN REDUCTION INTERNAL FIXATION (ORIF) DISTAL RADIAL FRACTURE OPEN REDUCTION INTERNAL FIXATION (ORIF) ANKLE FRACTURE  The patient needs urgent surgery.  She is having mobility issues.  We think this would be best done in the hospital setting.  There is a chance we would have to put an external fixator on her ankle.  However, we think that an open reduction and internal fixation of the ankle would be appropriate.  We will do an open reduction and internal fixation of the wrist as well to try to aid in her mobility.  She understands the risks and benefits.    The risks benefits and alternatives were discussed with the patient including but not limited to the risks of nonoperative treatment, versus surgical intervention including infection, bleeding, nerve injury,  blood clots, cardiopulmonary complications, morbidity, mortality, among others, and they were willing to proceed.   We additionally specifically discussed risks of axillary nerve injury, infection, periprosthetic fracture, continued pain and longevity of implants prior to beginning procedure.    Patient will be admitted for inpatient treatment for surgery, pain control, OT, prophylactic antibiotics, VTE prophylaxis, and discharge planning.   The patient acknowledged the explanation, agreed to proceed with the plan and consent was signed.   Operative Plan: ORIF left ankle and  wrist    Ethelda Chick, PA-C  12/15/2020 5:11 PM

## 2020-12-16 ENCOUNTER — Ambulatory Visit (HOSPITAL_COMMUNITY): Payer: Medicare Other | Admitting: Certified Registered Nurse Anesthetist

## 2020-12-16 ENCOUNTER — Observation Stay (HOSPITAL_COMMUNITY)
Admission: RE | Admit: 2020-12-16 | Discharge: 2020-12-18 | Disposition: A | Discharge status: Home / Self Care | Payer: Medicare Other | Attending: Orthopaedic Surgery | Admitting: Orthopaedic Surgery

## 2020-12-16 ENCOUNTER — Observation Stay (HOSPITAL_COMMUNITY): Payer: Medicare Other

## 2020-12-16 ENCOUNTER — Encounter (HOSPITAL_COMMUNITY): Payer: Self-pay | Admitting: Orthopaedic Surgery

## 2020-12-16 ENCOUNTER — Encounter (HOSPITAL_COMMUNITY)
Admission: RE | Disposition: A | Discharge status: Home / Self Care | Payer: Self-pay | Source: Home / Self Care | Attending: Orthopaedic Surgery

## 2020-12-16 DIAGNOSIS — Z20822 Contact with and (suspected) exposure to covid-19: Secondary | ICD-10-CM | POA: Insufficient documentation

## 2020-12-16 DIAGNOSIS — S52552A Other extraarticular fracture of lower end of left radius, initial encounter for closed fracture: Secondary | ICD-10-CM | POA: Diagnosis not present

## 2020-12-16 DIAGNOSIS — Z09 Encounter for follow-up examination after completed treatment for conditions other than malignant neoplasm: Secondary | ICD-10-CM

## 2020-12-16 DIAGNOSIS — K219 Gastro-esophageal reflux disease without esophagitis: Secondary | ICD-10-CM | POA: Diagnosis not present

## 2020-12-16 DIAGNOSIS — S52592A Other fractures of lower end of left radius, initial encounter for closed fracture: Secondary | ICD-10-CM | POA: Diagnosis not present

## 2020-12-16 DIAGNOSIS — S6292XA Unspecified fracture of left wrist and hand, initial encounter for closed fracture: Secondary | ICD-10-CM | POA: Diagnosis not present

## 2020-12-16 DIAGNOSIS — G8918 Other acute postprocedural pain: Secondary | ICD-10-CM | POA: Diagnosis not present

## 2020-12-16 DIAGNOSIS — W109XXA Fall (on) (from) unspecified stairs and steps, initial encounter: Secondary | ICD-10-CM | POA: Diagnosis not present

## 2020-12-16 DIAGNOSIS — Z882 Allergy status to sulfonamides status: Secondary | ICD-10-CM | POA: Insufficient documentation

## 2020-12-16 DIAGNOSIS — R2681 Unsteadiness on feet: Secondary | ICD-10-CM | POA: Diagnosis not present

## 2020-12-16 DIAGNOSIS — Z79899 Other long term (current) drug therapy: Secondary | ICD-10-CM | POA: Insufficient documentation

## 2020-12-16 DIAGNOSIS — S52502A Unspecified fracture of the lower end of left radius, initial encounter for closed fracture: Secondary | ICD-10-CM | POA: Diagnosis not present

## 2020-12-16 DIAGNOSIS — Z85828 Personal history of other malignant neoplasm of skin: Secondary | ICD-10-CM | POA: Diagnosis not present

## 2020-12-16 DIAGNOSIS — S82892A Other fracture of left lower leg, initial encounter for closed fracture: Secondary | ICD-10-CM | POA: Diagnosis present

## 2020-12-16 DIAGNOSIS — S9305XA Dislocation of left ankle joint, initial encounter: Secondary | ICD-10-CM | POA: Insufficient documentation

## 2020-12-16 DIAGNOSIS — Z9181 History of falling: Secondary | ICD-10-CM | POA: Diagnosis not present

## 2020-12-16 DIAGNOSIS — S82842A Displaced bimalleolar fracture of left lower leg, initial encounter for closed fracture: Secondary | ICD-10-CM | POA: Diagnosis not present

## 2020-12-16 HISTORY — DX: Unspecified rotator cuff tear or rupture of unspecified shoulder, not specified as traumatic: M75.100

## 2020-12-16 HISTORY — PX: OPEN REDUCTION INTERNAL FIXATION (ORIF) DISTAL RADIAL FRACTURE: SHX5989

## 2020-12-16 HISTORY — PX: ORIF ANKLE FRACTURE: SHX5408

## 2020-12-16 HISTORY — DX: Unspecified malignant neoplasm of skin, unspecified: C44.90

## 2020-12-16 HISTORY — DX: Cardiac murmur, unspecified: R01.1

## 2020-12-16 HISTORY — DX: Unspecified osteoarthritis, unspecified site: M19.90

## 2020-12-16 LAB — CREATININE, SERUM
Creatinine, Ser: 0.7 mg/dL (ref 0.44–1.00)
GFR, Estimated: >60 mL/min (ref >60)

## 2020-12-16 LAB — CBC
HCT: 40.2 % (ref 36.0–46.0)
Hemoglobin: 12.8 g/dL (ref 12.0–15.0)
MCH: 29.9 pg (ref 26.0–34.0)
MCHC: 31.8 g/dL (ref 30.0–36.0)
MCV: 93.9 fL (ref 80.0–100.0)
Platelets: 306 10*3/uL (ref 150–400)
RBC: 4.28 MIL/uL (ref 3.87–5.11)
RDW: 13 % (ref 11.5–15.5)
WBC: 8.5 10*3/uL (ref 4.0–10.5)
nRBC: 0 % (ref 0.0–0.2)

## 2020-12-16 LAB — SARS CORONAVIRUS 2 (TAT 6-24 HRS): SARS Coronavirus 2: NEGATIVE

## 2020-12-16 SURGERY — OPEN REDUCTION INTERNAL FIXATION (ORIF) DISTAL RADIUS FRACTURE
Anesthesia: General | Site: Ankle | Laterality: Left

## 2020-12-16 MED ORDER — DEXAMETHASONE SODIUM PHOSPHATE 10 MG/ML IJ SOLN
INTRAMUSCULAR | Status: DC | PRN
  Administered 2020-12-16: 5 mg via INTRAVENOUS

## 2020-12-16 MED ORDER — CITALOPRAM HYDROBROMIDE 20 MG PO TABS
20.0000 mg | ORAL_TABLET | Freq: Every day | ORAL | Status: DC
  Administered 2020-12-16 – 2020-12-18 (×3): 20 mg via ORAL
  Filled 2020-12-16 (×3): qty 1

## 2020-12-16 MED ORDER — POTASSIUM CHLORIDE 2 MEQ/ML IV SOLN
INTRAVENOUS | Status: DC
  Filled 2020-12-16: qty 1000

## 2020-12-16 MED ORDER — OXYCODONE HCL 5 MG PO TABS
5.0000 mg | ORAL_TABLET | Freq: Once | ORAL | Status: DC | PRN
Start: 2020-12-16 — End: 2020-12-16

## 2020-12-16 MED ORDER — PROMETHAZINE HCL 25 MG/ML IJ SOLN: 6.2500 mg | INTRAMUSCULAR | Status: DC | PRN

## 2020-12-16 MED ORDER — ACETAMINOPHEN 500 MG PO TABS
1000.0000 mg | ORAL_TABLET | Freq: Three times a day (TID) | ORAL | Status: DC
  Administered 2020-12-16 – 2020-12-18 (×6): 500 mg via ORAL
  Filled 2020-12-16 (×6): qty 2

## 2020-12-16 MED ORDER — FENTANYL CITRATE (PF) 100 MCG/2ML IJ SOLN
50.0000 ug | Freq: Once | INTRAMUSCULAR | Status: AC
  Administered 2020-12-16: 50 ug via INTRAVENOUS
  Filled 2020-12-16: qty 2

## 2020-12-16 MED ORDER — CEFAZOLIN SODIUM-DEXTROSE 1-4 GM/50ML-% IV SOLN
1.0000 g | Freq: Four times a day (QID) | INTRAVENOUS | Status: AC
  Administered 2020-12-16 – 2020-12-17 (×3): 1 g via INTRAVENOUS
  Filled 2020-12-16 (×3): qty 50

## 2020-12-16 MED ORDER — VANCOMYCIN HCL 1000 MG IV SOLR
INTRAVENOUS | Status: DC | PRN
  Administered 2020-12-16 (×2): 1000 mg via TOPICAL

## 2020-12-16 MED ORDER — ROPIVACAINE HCL 5 MG/ML IJ SOLN
INTRAMUSCULAR | Status: DC | PRN
  Administered 2020-12-16: 60 mL via PERINEURAL

## 2020-12-16 MED ORDER — PROPOFOL 10 MG/ML IV BOLUS
INTRAVENOUS | Status: DC | PRN
  Administered 2020-12-16: 140 mg via INTRAVENOUS

## 2020-12-16 MED ORDER — NAPROXEN 250 MG PO TABS
250.0000 mg | ORAL_TABLET | Freq: Two times a day (BID) | ORAL | Status: DC
  Administered 2020-12-16 – 2020-12-18 (×4): 250 mg via ORAL
  Filled 2020-12-16 (×5): qty 1

## 2020-12-16 MED ORDER — ORAL CARE MOUTH RINSE: 15.0000 mL | Freq: Once | OROMUCOSAL | Status: AC

## 2020-12-16 MED ORDER — ONDANSETRON HCL 4 MG/2ML IJ SOLN
INTRAMUSCULAR | Status: DC | PRN
  Administered 2020-12-16: 4 mg via INTRAVENOUS

## 2020-12-16 MED ORDER — PHENYLEPHRINE 40 MCG/ML (10ML) SYRINGE FOR IV PUSH (FOR BLOOD PRESSURE SUPPORT)
PREFILLED_SYRINGE | INTRAVENOUS | Status: DC | PRN
  Administered 2020-12-16: 80 ug via INTRAVENOUS
  Administered 2020-12-16: 40 ug via INTRAVENOUS

## 2020-12-16 MED ORDER — ONDANSETRON HCL 4 MG PO TABS: 4.0000 mg | ORAL_TABLET | Freq: Four times a day (QID) | ORAL | Status: DC | PRN

## 2020-12-16 MED ORDER — DOCUSATE SODIUM 100 MG PO CAPS
100.0000 mg | ORAL_CAPSULE | Freq: Two times a day (BID) | ORAL | Status: DC
  Administered 2020-12-16 – 2020-12-17 (×2): 100 mg via ORAL
  Filled 2020-12-16 (×2): qty 1

## 2020-12-16 MED ORDER — ALPRAZOLAM 0.25 MG PO TABS
0.2500 mg | ORAL_TABLET | Freq: Two times a day (BID) | ORAL | Status: DC | PRN
  Administered 2020-12-16: 0.25 mg via ORAL
  Filled 2020-12-16: qty 1

## 2020-12-16 MED ORDER — MIDAZOLAM HCL 2 MG/2ML IJ SOLN
1.0000 mg | Freq: Once | INTRAMUSCULAR | Status: AC
  Administered 2020-12-16: 2 mg via INTRAVENOUS
  Filled 2020-12-16: qty 2

## 2020-12-16 MED ORDER — BISACODYL 5 MG PO TBEC: 5.0000 mg | DELAYED_RELEASE_TABLET | Freq: Every day | ORAL | Status: DC | PRN

## 2020-12-16 MED ORDER — OXYCODONE HCL 5 MG PO TABS
2.5000 mg | ORAL_TABLET | ORAL | Status: DC | PRN
  Administered 2020-12-17 (×2): 2.5 mg via ORAL
  Administered 2020-12-17 – 2020-12-18 (×2): 5 mg via ORAL
  Filled 2020-12-16 (×4): qty 1

## 2020-12-16 MED ORDER — OXYCODONE HCL 5 MG PO TABS
10.0000 mg | ORAL_TABLET | Freq: Four times a day (QID) | ORAL | Status: DC | PRN
Start: 2020-12-16 — End: 2020-12-18

## 2020-12-16 MED ORDER — ENOXAPARIN SODIUM 40 MG/0.4ML IJ SOSY
40.0000 mg | PREFILLED_SYRINGE | INTRAMUSCULAR | Status: DC
  Administered 2020-12-17 – 2020-12-18 (×2): 40 mg via SUBCUTANEOUS
  Filled 2020-12-16 (×2): qty 0.4

## 2020-12-16 MED ORDER — OXYCODONE HCL 5 MG/5ML PO SOLN
5.0000 mg | Freq: Once | ORAL | Status: DC | PRN
Start: 2020-12-16 — End: 2020-12-16

## 2020-12-16 MED ORDER — FLEET ENEMA 7-19 GM/118ML RE ENEM: 1.0000 | ENEMA | Freq: Once | RECTAL | Status: DC | PRN

## 2020-12-16 MED ORDER — POLYETHYLENE GLYCOL 3350 17 G PO PACK: 17.0000 g | PACK | Freq: Every day | ORAL | Status: DC | PRN

## 2020-12-16 MED ORDER — PANTOPRAZOLE SODIUM 40 MG PO TBEC
40.0000 mg | DELAYED_RELEASE_TABLET | Freq: Every day | ORAL | Status: DC
  Administered 2020-12-16 – 2020-12-18 (×3): 40 mg via ORAL
  Filled 2020-12-16 (×3): qty 1

## 2020-12-16 MED ORDER — CHLORHEXIDINE GLUCONATE 0.12 % MT SOLN
15.0000 mL | Freq: Once | OROMUCOSAL | Status: AC
  Administered 2020-12-16: 15 mL via OROMUCOSAL

## 2020-12-16 MED ORDER — DIPHENHYDRAMINE HCL 12.5 MG/5ML PO ELIX: 12.5000 mg | ORAL_SOLUTION | ORAL | Status: DC | PRN

## 2020-12-16 MED ORDER — CEFAZOLIN SODIUM-DEXTROSE 2-4 GM/100ML-% IV SOLN
2.0000 g | INTRAVENOUS | Status: AC
  Administered 2020-12-16: 2 g via INTRAVENOUS
  Filled 2020-12-16: qty 100

## 2020-12-16 MED ORDER — ONDANSETRON HCL 4 MG/2ML IJ SOLN: 4.0000 mg | Freq: Four times a day (QID) | INTRAMUSCULAR | Status: DC | PRN

## 2020-12-16 MED ORDER — METOCLOPRAMIDE HCL 5 MG PO TABS: 5.0000 mg | ORAL_TABLET | Freq: Three times a day (TID) | ORAL | Status: DC | PRN

## 2020-12-16 MED ORDER — AMISULPRIDE (ANTIEMETIC) 5 MG/2ML IV SOLN: 10.0000 mg | Freq: Once | INTRAVENOUS | Status: DC | PRN

## 2020-12-16 MED ORDER — HYDROMORPHONE HCL 1 MG/ML IJ SOLN: 0.2500 mg | INTRAMUSCULAR | Status: DC | PRN

## 2020-12-16 MED ORDER — METOCLOPRAMIDE HCL 5 MG/ML IJ SOLN: 5.0000 mg | Freq: Three times a day (TID) | INTRAMUSCULAR | Status: DC | PRN

## 2020-12-16 MED ORDER — LACTATED RINGERS IV SOLN: INTRAVENOUS | Status: DC

## 2020-12-16 SURGICAL SUPPLY — 101 items
BAG COUNTER SPONGE SURGICOUNT (BAG) ×3 IMPLANT
BAG ZIPLOCK 12X15 (MISCELLANEOUS) ×3 IMPLANT
BANDAGE ESMARK 6X9 LF (GAUZE/BANDAGES/DRESSINGS) ×2 IMPLANT
BIT DRILL 2 CANN GRADUATED (BIT) ×3 IMPLANT
BIT DRILL 2.5 CANN LNG (BIT) ×3 IMPLANT
BIT DRILL 2.5 CANN REUSE (DRILL) ×3 IMPLANT
BIT DRILL 2.5 CANN STRL (BIT) ×3 IMPLANT
BIT DRILL 2.6 CANN (BIT) ×3 IMPLANT
BIT DRILL 2.7 (BIT) ×1
BIT DRILL 2.7X2.7/3XSCR ANKL (BIT) ×2 IMPLANT
BIT DRILL SHORT 1.7 (BIT) ×3 IMPLANT
BIT DRL 2.7X2.7/3XSCR ANKL (BIT) ×2
BNDG COHESIVE 4X5 TAN ST LF (GAUZE/BANDAGES/DRESSINGS) ×3 IMPLANT
BNDG ELASTIC 3X5.8 VLCR STR LF (GAUZE/BANDAGES/DRESSINGS) ×3 IMPLANT
BNDG ELASTIC 4X5.8 VLCR STR LF (GAUZE/BANDAGES/DRESSINGS) ×3 IMPLANT
BNDG ELASTIC 6X10 VLCR STRL LF (GAUZE/BANDAGES/DRESSINGS) ×3 IMPLANT
BNDG ELASTIC 6X5.8 VLCR STR LF (GAUZE/BANDAGES/DRESSINGS) ×3 IMPLANT
BNDG ESMARK 4X9 LF (GAUZE/BANDAGES/DRESSINGS) ×3 IMPLANT
BNDG ESMARK 6X9 LF (GAUZE/BANDAGES/DRESSINGS) ×3
CHLORAPREP W/TINT 26 (MISCELLANEOUS) ×3 IMPLANT
CLEANER TIP ELECTROSURG 2X2 (MISCELLANEOUS) ×3 IMPLANT
CORTEX SCREW 2.4X22MM VAL NEAR (Screw) ×3 IMPLANT
COVER MAYO STAND STRL (DRAPES) ×3 IMPLANT
COVER SURGICAL LIGHT HANDLE (MISCELLANEOUS) ×3 IMPLANT
CUFF TOURN SGL QUICK 18X4 (TOURNIQUET CUFF) ×3 IMPLANT
CUFF TOURN SGL QUICK 24 (TOURNIQUET CUFF) ×1
CUFF TRNQT CYL 24X4X16.5-23 (TOURNIQUET CUFF) ×2 IMPLANT
DRAPE C-ARM 42X120 X-RAY (DRAPES) ×3 IMPLANT
DRAPE OEC MINIVIEW 54X84 (DRAPES) ×3 IMPLANT
DRAPE U-SHAPE 47X51 STRL (DRAPES) ×3 IMPLANT
DRSG PAD ABDOMINAL 8X10 ST (GAUZE/BANDAGES/DRESSINGS) ×3 IMPLANT
DURAPREP 26ML APPLICATOR (WOUND CARE) ×3 IMPLANT
ELECT REM PT RETURN 15FT ADLT (MISCELLANEOUS) ×3 IMPLANT
GAUZE SPONGE 4X4 12PLY STRL (GAUZE/BANDAGES/DRESSINGS) ×3 IMPLANT
GAUZE XEROFORM 1X8 LF (GAUZE/BANDAGES/DRESSINGS) ×3 IMPLANT
GLOVE SRG 8 PF TXTR STRL LF DI (GLOVE) ×2 IMPLANT
GLOVE SURG ENC MOIS LTX SZ6.5 (GLOVE) ×6 IMPLANT
GLOVE SURG ENC MOIS LTX SZ7.5 (GLOVE) ×3 IMPLANT
GLOVE SURG NEOPR MICRO LF SZ8 (GLOVE) ×3 IMPLANT
GLOVE SURG UNDER POLY LF SZ6.5 (GLOVE) ×3 IMPLANT
GLOVE SURG UNDER POLY LF SZ8 (GLOVE) ×1
GOWN STRL REUS W/ TWL LRG LVL3 (GOWN DISPOSABLE) ×4 IMPLANT
GOWN STRL REUS W/TWL LRG LVL3 (GOWN DISPOSABLE) ×5 IMPLANT
GOWN STRL REUS W/TWL XL LVL3 (GOWN DISPOSABLE) ×3 IMPLANT
GUIDEWIRE 1.35MM (WIRE) ×12 IMPLANT
K-WIRE BB-TAK (WIRE) ×3
KIT BASIN OR (CUSTOM PROCEDURE TRAY) ×3 IMPLANT
KIT TURNOVER KIT A (KITS) ×3 IMPLANT
KWIRE BB-TAK (WIRE) ×2 IMPLANT
MANIFOLD NEPTUNE II (INSTRUMENTS) ×3 IMPLANT
NS IRRIG 1000ML POUR BTL (IV SOLUTION) ×3 IMPLANT
PACK ORTHO EXTREMITY (CUSTOM PROCEDURE TRAY) ×3 IMPLANT
PAD ARMBOARD 7.5X6 YLW CONV (MISCELLANEOUS) ×6 IMPLANT
PAD CAST 3X4 CTTN HI CHSV (CAST SUPPLIES) ×2 IMPLANT
PAD CAST 4YDX4 CTTN HI CHSV (CAST SUPPLIES) ×2 IMPLANT
PADDING CAST COTTON 3X4 STRL (CAST SUPPLIES) ×1
PADDING CAST COTTON 4X4 STRL (CAST SUPPLIES) ×1
PADDING CAST COTTON 6X4 STRL (CAST SUPPLIES) ×3 IMPLANT
PLATE DIST VOLAR NARROW 3H LT (Plate) ×3 IMPLANT
PLATE DISTAL 4H (Plate) ×3 IMPLANT
PROTECTOR NERVE ULNAR (MISCELLANEOUS) ×3 IMPLANT
SCREW CORTEX 2.4X22MM VAL NEAR (Screw) ×2 IMPLANT
SCREW CORTEX 2.7X24MM LP SS (Screw) ×3 IMPLANT
SCREW CORTEX VAL NEAR 2.4X18MM (Screw) ×3 IMPLANT
SCREW CORTEX VAL NEAR 2.4X20MM (Screw) ×6 IMPLANT
SCREW CORTEX VAL NEAR 2.4X24 (Screw) ×3 IMPLANT
SCREW LOCK T15 FT 14X3.5XST (Screw) ×2 IMPLANT
SCREW LOCK T15 FT 16X3.5XST (Screw) ×2 IMPLANT
SCREW LOCKING 2.7X12 ANKLE (Screw) ×3 IMPLANT
SCREW LOCKING 2.7X14MM (Screw) ×9 IMPLANT
SCREW LOCKING 2.7X16MM (Screw) ×3 IMPLANT
SCREW LOCKING 3.5X14MM (Screw) ×1 IMPLANT
SCREW LOCKING 3.5X16MM (Screw) ×1 IMPLANT
SCREW LOW PROFILE 3.5X16 (Screw) ×3 IMPLANT
SCREW LP CANN 4.0X45MM (Screw) ×6 IMPLANT
SCREW NLOCK 14X3.5XLOPRFL NS (Screw) ×2 IMPLANT
SCREW NONLOCK 3.5X14 (Screw) ×1 IMPLANT
SCREW NONLOCK LP 3.5X16 (Screw) ×3 IMPLANT
SLEEVE DRILL 1.7 GUIDE (IRRIGATION / IRRIGATOR) ×18 IMPLANT
SLING ARM FOAM STRAP LRG (SOFTGOODS) ×3 IMPLANT
SPLINT PLASTER CAST XFAST 5X30 (CAST SUPPLIES) ×2 IMPLANT
SPLINT PLASTER XFAST SET 5X30 (CAST SUPPLIES) ×1
STRIP CLOSURE SKIN 1/2X4 (GAUZE/BANDAGES/DRESSINGS) ×3 IMPLANT
SUT ETHILON 3 0 PS 1 (SUTURE) ×3 IMPLANT
SUT MNCRL AB 4-0 PS2 18 (SUTURE) ×3 IMPLANT
SUT VIC AB 0 CT1 27 (SUTURE) ×1
SUT VIC AB 0 CT1 27XBRD ANBCTR (SUTURE) ×2 IMPLANT
SUT VIC AB 0 CT1 36 (SUTURE) ×3 IMPLANT
SUT VIC AB 2-0 CT1 27 (SUTURE) ×1
SUT VIC AB 2-0 CT1 TAPERPNT 27 (SUTURE) ×2 IMPLANT
SUT VIC AB 3-0 SH 27 (SUTURE) ×1
SUT VIC AB 3-0 SH 27X BRD (SUTURE) ×2 IMPLANT
SYR CONTROL 10ML LL (SYRINGE) ×3 IMPLANT
TOWEL OR 17X26 10 PK STRL BLUE (TOWEL DISPOSABLE) ×3 IMPLANT
TOWEL OR NON WOVEN STRL DISP B (DISPOSABLE) ×3 IMPLANT
TUBING CONNECTING 10 (TUBING) ×3 IMPLANT
UNDERPAD 30X36 HEAVY ABSORB (UNDERPADS AND DIAPERS) ×3 IMPLANT
WASHER (Orthopedic Implant) ×1 IMPLANT
WASHER ORTHO 7X (Orthopedic Implant) ×2 IMPLANT
WATER STERILE IRR 1000ML POUR (IV SOLUTION) ×3 IMPLANT
YANKAUER SUCT BULB TIP NO VENT (SUCTIONS) ×3 IMPLANT

## 2020-12-16 NOTE — Progress Notes (Signed)
AssistedDr. Sabra Heck with left, ultrasound guided, supraclavicular, popliteal, adductor canal block. Side rails up, monitors on throughout procedure. See vital signs in flow sheet. Tolerated Procedure well.

## 2020-12-16 NOTE — Op Note (Signed)
Orthopaedic Surgery Operative Note (CSN: [id number])  Dominique Brooks  02/10/1951 Date of Surgery: 12/16/2020   Diagnoses:  LEFT WRIST FRACTURE AND LEFT ANKLE open reduction internal fixation  Procedure: Left distal radius extraarticular Left bimalleolar ankle fracture open reduction internal fixation    Operative Finding Successful completion of the planned procedure.  Patient's bone quality was moderate at best.  Her wrist was nondisplaced but the plate provided her enough support that we feel that she can weight-bear through her elbow easily and soon through her wrist.  Her ankle bone quality was poor and her lag screw had poor fixation but we had good fixation without a locking screw laterally.  We we will admit the patient as she has poor mobility and may end up needing skilled nursing placement  Post-operative plan: The patient will be admitted overnight for observation and may stay inpatient if she does not mobilize with therapy and needs SNF.  The patient will be touchdown weightbearing left ankle and left wrist, weightbearing to tolerance left elbow.  DVT prophylaxis Lovenox 40 mg/day until mobilizing and then consider transition in clinic to alternative medicines.   Pain control with PRN pain medication preferring oral medicines.  Follow up plan will be scheduled in approximately 7 days for incision check and XR.  Post-Op Diagnosis: Same Surgeons:Primary: Hiram Gash, MD Assistants:Caroline McBane PA-C Location: Domin[facility] 06 Anesthesia: General with regional anesthesia Antibiotics: Ancef 2 g with local vancomycin powder 1 g at the surgical site Tourniquet time:  Total Tourniquet Time Documented: Calf (Left) - 58 minutes Calf (Left) - 24 minutes Total: Calf (Left) - 82 minutes  Estimated Blood Loss: Minimal Complications: None Specimens: None Implants: Implant Name Type Inv. Item Serial No. Manufacturer Lot No. LRB No. Used Action  SCREW CORTEX 2.7X24MM LP SS - [id number]  Screw SCREW CORTEX 2.7X24MM LP SS  [employer]  Left 1 Implanted  SCREW LOW PROFILE 3.5X16 - [id number] Screw SCREW LOW PROFILE 3.5X16  [employer]  Left 1 Implanted  SCREW LOCKING 2.7X14MM - [id number] Screw SCREW LOCKING 2.7X14MM  [employer]  Left 3 Implanted  SCREW LOCKING 2.7X12 ANKLE - [id number] Screw SCREW LOCKING 2.7X12 ANKLE  [employer]  Left 1 Implanted  SCREW LOCKING 2.7X16MM - [id number] Screw SCREW LOCKING 2.7X16MM  [employer]  Left 1 Implanted  SCREW LOCKING 3.5X16MM - [id number] Screw SCREW LOCKING 3.5X16MM  [employer]  Left 1 Implanted  SCREW LOCKING 3.5X14MM - [id number] Screw SCREW LOCKING 3.5X14MM  [employer]  Left 1 Implanted  WASHER - [id number] Orthopedic Implant WASHER  [employer]  Left 1 Implanted  4.0 long thread cann screw, 8m    [employer]  Left 2 Implanted  4 hole distal fib plate, left    [employer]  Left 1 Implanted  SCREW NONLOCK LP 3.5X16 - [id number] SCREW NONLOCK LP 3.5X16  [employer]  Left 1 Implanted  SCREW CORTEX VAL NEAR 2.4X20MM - [id number] SCREW CORTEX VAL NEAR 2.4X20MM  [employer]  Left 2 Implanted  CORTEX SCREW 2.4X22MM VAL NEAR - [id number] CORTEX SCREW 2.4X22MM VAL NEAR  [employer]  Left 1 Implanted  SCREW CORTEX VAL NEAR 2.4X24 - [id number] SCREW CORTEX VAL NEAR 2.4X24  [employer]  Left 1 Implanted  SCREW NONLOCK 3.5X14 - [id number] SCREW NONLOCK 3.5X14  [employer]  Left 1 Implanted  PLATE DIST VOLAR NARROW 3H LT - [id number] PLATE DIST VOLAR NARROW 3H LT  [employer]  Left 1 Implanted  2.4  VAL PART THREAD, 18MM    [employer]  Left 1 Implanted    Indications for Surgery:   Dominique Brooks is a 70 y.o. female with fall and multiple injuries including a left nondisplaced distal radius fracture and a fracture dislocation of the left ankle.  She had struggled with mobility and lives alone and was concerned about her ability to mobilize even after fixation.  Based on this even with her nondisplaced fracture we felt  that fixing her wrist would help with her mobility.  Benefits and risks of operative and nonoperative management were discussed prior to surgery with patient/guardian(s) and informed consent form was completed.  Specific risks including infection, need for additional surgery, nonunion, malunion, neurovascular compromise, loss of fixation and wound healing issues amongst others.   Procedure:   The patient was identified properly. Informed consent was obtained and the surgical site was marked. The patient was taken up to suite where general anesthesia was induced.  The patient was positioned supine on a regular bed.  The left wrist and ankle was prepped and draped in the usual sterile fashion.  Timeout was performed before the beginning of the case.  Tourniquet was used for the above duration.  We began with ankle.  We began with our ORIF of the fibula. A longitudinal approach was made along the lateral border of the fibula centered at the fracture site. We dissected down taking care to avoid the superficial peroneal nerve which crossed proximal to our incision. We encountered the fracture site and noted a short oblique fracture. The bone quality was moderate. We are able to reduce it anatomically. We identified that the fracture was amenable to lag screw fixation.  We placed a 2.7 mm lag screw but had poor purchase.  We used a precontoured locking plate.  We then filled 5 holes distal to the fracture site and 3 holes proximal achieving bicortical fixation with all screws.  We confirmed anatomic reduction of fluoroscopy and then turned our attention to the medial side.   An oblique incision was made over the anterior aspect of the medial malleolus were able to identify the fracture site itself. A point the fracture site was cleared of interposed periosteum and we were able to obtain an anatomic reduction was held with point-to-point clamp. At this point we placed 2 partially threaded 45 mm cannulated  screws with washers across the fracture site achieving good purchase and good compression with each screw.   Attention was turned to the wrist.  An FCR approach was made exposing the volar surface of the distal radius taking care to go through the sheath of the FCR tendon tract and ulnarly exposing the inferior portion of the sheath while protecting the median nerve and radial artery on each side with blunt retractors.  This inferior portion of the sheath was incised sharply and examined for presence of the palmar cutaneous branch of the median nerve.  It was determined to not be within the field and we carried our dissection deeply to the bone splitting the pronator quadratus and exposing the fracture site.    Appropriate reduction was obtained and a narrow Arthrex distal radius plate was placed and checked for sizing and reduction under fluoroscopy.  This reduction was held in place with K wires and a K wire was placed into the radial styloid.    Once appropriate reduction was confirmed we then proceeded to fix the plate proximally and then proceeded to fill the distal holes with a combination  of partially threaded screws and pegs.  At this point we checked our reduction to ensure that there was no intra-articular extension of our screws.  Once this was confirmed we proceeded to fill remaining 2 proximal shaft screws and obtained final images which demonstrated appropriate reduction and maintenance of alignment.  The DRUJ was checked and found to be stable.  We verified that all fast guides were removed on XR and through count.    The wound was thoroughly irrigated.  The tourniquet was released prior to skin closure to verify there was no excessive bleeding and we visualized that the radial artery and median nerve were intact at the end of the case. The PQ was reapproximated grossly prior to skin closure.      We irrigated the wound copiously before placing local antibiotic as listed above.  We  closed the incision in a multilayer fashion with absorbable suture at the wrist we used nylon at the ankles the skin was somewhat traumatized appearing.  Sterile dressing was placed.  Well molded well-padded short leg splint was placed on the ankle and the wrist was placed in a bulky dressing to be transition to a removable wrist splint.  Patient was awoken taken to PACU in stable condition.  Noemi Chapel, PA-C, present and scrubbed throughout the case, critical for completion in a timely fashion, and for retraction, instrumentation, closure.

## 2020-12-16 NOTE — Anesthesia Postprocedure Evaluation (Signed)
Anesthesia Post Note  Patient: Dominique Brooks  Procedure(s) Performed: OPEN REDUCTION INTERNAL FIXATION (ORIF) DISTAL RADIAL FRACTURE (Left) OPEN REDUCTION INTERNAL FIXATION (ORIF) ANKLE FRACTURE (Left: Ankle)     Patient location during evaluation: PACU Anesthesia Type: General Level of consciousness: awake and alert Pain management: pain level controlled Vital Signs Assessment: post-procedure vital signs reviewed and stable Respiratory status: spontaneous breathing, nonlabored ventilation and respiratory function stable Cardiovascular status: blood pressure returned to baseline and stable Postop Assessment: no apparent nausea or vomiting Anesthetic complications: no   No notable events documented.  Last Vitals:  Vitals:   12/16/20 1530 12/16/20 1547  BP: 131/86 126/87  Pulse: 84 86  Resp: 18 18  Temp: 36.5 C 36.8 C  SpO2: 94% 98%    Last Pain:  Vitals:   12/16/20 1547  TempSrc: Oral  PainSc: 0-No pain                 Lynda Rainwater

## 2020-12-16 NOTE — Interval H&P Note (Signed)
All questions answered, patient wants to proceed with procedure.  We may have to ex fix the ankle if her skin is too damaged to do currently.

## 2020-12-16 NOTE — Transfer of Care (Signed)
Immediate Anesthesia Transfer of Care Note  Patient: Dominique Brooks  Procedure(s) Performed: OPEN REDUCTION INTERNAL FIXATION (ORIF) DISTAL RADIAL FRACTURE (Left) OPEN REDUCTION INTERNAL FIXATION (ORIF) ANKLE FRACTURE (Left: Ankle)  Patient Location: PACU  Anesthesia Type:GA combined with regional for post-op pain  Level of Consciousness: awake, alert , oriented and patient cooperative  Airway & Oxygen Therapy: Patient Spontanous Breathing  Post-op Assessment: Report given to RN and Post -op Vital signs reviewed and stable  Post vital signs: Reviewed and stable  Last Vitals:  Vitals Value Taken Time  BP 133/91 12/16/20 1449  Temp    Pulse 95 12/16/20 1454  Resp 11 12/16/20 1454  SpO2 91 % 12/16/20 1454  Vitals shown include unvalidated device data.  Last Pain:  Vitals:   12/16/20 1303  TempSrc:   PainSc: 0-No pain      Patients Stated Pain Goal: 3 ([phone])  Complications: No notable events documented.

## 2020-12-16 NOTE — Anesthesia Procedure Notes (Signed)
Anesthesia Regional Block: Supraclavicular block   Pre-Anesthetic Checklist: , timeout performed,  Correct Patient, Correct Site, Correct Laterality,  Correct Procedure, Correct Position, site marked,  Risks and benefits discussed,  Surgical consent,  Pre-op evaluation,  At surgeon's request and post-op pain management  Laterality: Left  Prep: chloraprep       Needles:  Injection technique: Single-shot  Needle Type: Stimiplex     Needle Length: 9cm  Needle Gauge: 21     Additional Needles:   Procedures:,,,, ultrasound used (permanent image in chart),,    Narrative:  Start time: 12/16/2020 1:02 PM End time: 12/16/2020 1:07 PM Injection made incrementally with aspirations every 5 mL.  Performed by: Personally  Anesthesiologist: Lynda Rainwater, MD

## 2020-12-16 NOTE — Anesthesia Procedure Notes (Signed)
Anesthesia Regional Block: Adductor canal block   Pre-Anesthetic Checklist: , timeout performed,  Correct Patient, Correct Site, Correct Laterality,  Correct Procedure, Correct Position, site marked,  Risks and benefits discussed,  Surgical consent,  Pre-op evaluation,  At surgeon's request and post-op pain management  Laterality: Left  Prep: chloraprep       Needles:  Injection technique: Single-shot  Needle Type: Stimiplex     Needle Length: 9cm  Needle Gauge: 21     Additional Needles:   Procedures:,,,, ultrasound used (permanent image in chart),,    Narrative:  Start time: 12/16/2020 1:01 PM End time: 12/16/2020 1:06 PM Injection made incrementally with aspirations every 5 mL.  Performed by: Personally  Anesthesiologist: Lynda Rainwater, MD

## 2020-12-16 NOTE — Anesthesia Procedure Notes (Signed)
Anesthesia Regional Block: Popliteal block   Pre-Anesthetic Checklist: , timeout performed,  Correct Patient, Correct Site, Correct Laterality,  Correct Procedure, Correct Position, site marked,  Risks and benefits discussed,  Surgical consent,  Pre-op evaluation,  At surgeon's request and post-op pain management  Laterality: Left  Prep: chloraprep       Needles:  Injection technique: Single-shot  Needle Type: Stimiplex     Needle Length: 9cm  Needle Gauge: 21     Additional Needles:   Procedures:,,,, ultrasound used (permanent image in chart),,    Narrative:  Start time: 12/16/2020 1:03 PM End time: 12/16/2020 1:08 PM Injection made incrementally with aspirations every 5 mL.  Performed by: Personally  Anesthesiologist: Lynda Rainwater, MD

## 2020-12-16 NOTE — Plan of Care (Signed)

## 2020-12-16 NOTE — Anesthesia Preprocedure Evaluation (Signed)
Anesthesia Evaluation  Patient identified by MRN, date of birth, ID band Patient awake    Reviewed: Allergy & Precautions, NPO status , Patient's Chart, lab work & pertinent test results  Airway Mallampati: II  TM Distance: >3 FB Neck ROM: Full    Dental no notable dental hx.    Pulmonary neg pulmonary ROS,    Pulmonary exam normal breath sounds clear to auscultation       Cardiovascular negative cardio ROS Normal cardiovascular exam Rhythm:Regular Rate:Normal     Neuro/Psych negative neurological ROS  negative psych ROS   GI/Hepatic Neg liver ROS, GERD  ,  Endo/Other  negative endocrine ROS  Renal/GU negative Renal ROS  negative genitourinary   Musculoskeletal  (+) Arthritis , Osteoarthritis,    Abdominal   Peds negative pediatric ROS (+)  Hematology negative hematology ROS (+)   Anesthesia Other Findings   Reproductive/Obstetrics negative OB ROS                             Anesthesia Physical Anesthesia Plan  ASA: 2  Anesthesia Plan: General   Post-op Pain Management:  Regional for Post-op pain   Induction: Intravenous  PONV Risk Score and Plan: 3 and Ondansetron, Dexamethasone, Midazolam and Treatment may vary due to age or medical condition  Airway Management Planned: LMA  Additional Equipment:   Intra-op Plan:   Post-operative Plan: Extubation in OR  Informed Consent: I have reviewed the patients History and Physical, chart, labs and discussed the procedure including the risks, benefits and alternatives for the proposed anesthesia with the patient or authorized representative who has indicated his/her understanding and acceptance.     Dental advisory given  Plan Discussed with: CRNA  Anesthesia Plan Comments:         Anesthesia Quick Evaluation

## 2020-12-16 NOTE — Anesthesia Procedure Notes (Signed)
Procedure Name: LMA Insertion Date/Time: 12/16/2020 1:15 PM Performed by: Raenette Rover, CRNA Pre-anesthesia Checklist: Patient identified, Emergency Drugs available, Suction available and Patient being monitored Patient Re-evaluated:Patient Re-evaluated prior to induction Oxygen Delivery Method: Circle system utilized Preoxygenation: Pre-oxygenation with 100% oxygen Induction Type: IV induction LMA: LMA inserted LMA Size: 4.0 Number of attempts: 1 Placement Confirmation: positive ETCO2 and breath sounds checked- equal and bilateral Tube secured with: Tape Dental Injury: Teeth and Oropharynx as per pre-operative assessment

## 2020-12-17 DIAGNOSIS — Z20822 Contact with and (suspected) exposure to covid-19: Secondary | ICD-10-CM | POA: Diagnosis not present

## 2020-12-17 DIAGNOSIS — S6292XA Unspecified fracture of left wrist and hand, initial encounter for closed fracture: Secondary | ICD-10-CM | POA: Diagnosis not present

## 2020-12-17 DIAGNOSIS — Z9181 History of falling: Secondary | ICD-10-CM | POA: Diagnosis not present

## 2020-12-17 DIAGNOSIS — S9305XA Dislocation of left ankle joint, initial encounter: Secondary | ICD-10-CM | POA: Diagnosis not present

## 2020-12-17 DIAGNOSIS — Z79899 Other long term (current) drug therapy: Secondary | ICD-10-CM | POA: Diagnosis not present

## 2020-12-17 DIAGNOSIS — Z882 Allergy status to sulfonamides status: Secondary | ICD-10-CM | POA: Diagnosis not present

## 2020-12-17 DIAGNOSIS — Z85828 Personal history of other malignant neoplasm of skin: Secondary | ICD-10-CM | POA: Diagnosis not present

## 2020-12-17 DIAGNOSIS — R2681 Unsteadiness on feet: Secondary | ICD-10-CM | POA: Diagnosis not present

## 2020-12-17 DIAGNOSIS — S82842A Displaced bimalleolar fracture of left lower leg, initial encounter for closed fracture: Secondary | ICD-10-CM | POA: Diagnosis not present

## 2020-12-17 LAB — CBC
HCT: 37.5 % (ref 36.0–46.0)
Hemoglobin: 11.9 g/dL — ABNORMAL LOW (ref 12.0–15.0)
MCH: 29.7 pg (ref 26.0–34.0)
MCHC: 31.7 g/dL (ref 30.0–36.0)
MCV: 93.5 fL (ref 80.0–100.0)
Platelets: 326 10*3/uL (ref 150–400)
RBC: 4.01 MIL/uL (ref 3.87–5.11)
RDW: 12.7 % (ref 11.5–15.5)
WBC: 9.4 10*3/uL (ref 4.0–10.5)
nRBC: 0 % (ref 0.0–0.2)

## 2020-12-17 LAB — BASIC METABOLIC PANEL
Anion gap: 8 (ref 5–15)
BUN: 17 mg/dL (ref 8–23)
CO2: 26 mmol/L (ref 22–32)
Calcium: 9 mg/dL (ref 8.9–10.3)
Chloride: 104 mmol/L (ref 98–111)
Creatinine, Ser: 0.64 mg/dL (ref 0.44–1.00)
GFR, Estimated: >60 mL/min (ref >60)
Glucose, Bld: 139 mg/dL — ABNORMAL HIGH (ref 70–99)
Potassium: 4.2 mmol/L (ref 3.5–5.1)
Sodium: 138 mmol/L (ref 135–145)

## 2020-12-17 MED ORDER — TRAZODONE HCL 50 MG PO TABS
50.0000 mg | ORAL_TABLET | Freq: Every day | ORAL | Status: DC
Start: 2020-12-17 — End: 2020-12-18
  Administered 2020-12-17: 50 mg via ORAL
  Filled 2020-12-17: qty 1

## 2020-12-17 NOTE — Progress Notes (Signed)
   ORTHOPAEDIC PROGRESS NOTE  s/p Procedure(s): OPEN REDUCTION INTERNAL FIXATION (ORIF) DISTAL RADIAL FRACTURE OPEN REDUCTION INTERNAL FIXATION (ORIF) ANKLE FRACTURE  SUBJECTIVE: Reports mild pain about both operative site. Nerve block for left arm starting wearing off overnight. Left leg is still numb from nerve block. She has been using a pure wick. It was misplaced overnight causing her bed to get wet. She had to wait a few hours before her bed got changed. She did not sleep well over night. No chest pain. No SOB. No nausea/vomiting. No other complaints.  OBJECTIVE: PE: General: resting in hospital bed, NAD Cardiac: regular rate Pulmonary: no increased work of breathing Left upper extremity: dressing CDI. Removable brace over top of bulky dressing. Skin intact though cannot assess fully beneath splint. Nontender to palpation proximally. Flexes and extends all fingers. Sensation intact in medial, radial, and ulnar distributions. Well perfused digits.  Left lower extremity: splint CDI, motor and sensory function unable to be tested due to nerve block, warm well perfused toes   Vitals:   12/17/20 0132 12/17/20 0556  BP: 111/72 124/77  Pulse: 92 65  Resp: 18 17  Temp: 98.2 F (36.8 C) 97.6 F (36.4 C)  SpO2: 91% 92%     ASSESSMENT: Dominique Brooks is a 70 y.o. female doing well postoperatively. - s/p ORIF left wrist - s/p ORIF left ankle  - POD#1  PLAN: Weightbearing:   - LUE: NWB left wrist, WBAT through left elbow, okay to use left hand for ADLs  - LLE: Touch down weight bearing Insicional and dressing care:   - LUE: reinforce as needed  - LLE: keep splint clean and dry Orthopedic device(s):   - LUE: removable splint  - LLE: splint Showering: Hold for now VTE prophylaxis: Lovenox '40mg'$  qd  Pain control: PRN pain medications, preferring oral medications Follow - up plan: TBD. PT/OT evals pending. If she does not mobilize well, may require SNF. Requested TOC  consultation Contact information:  Weekdays 8-5 Dr. Ophelia Charter, Noemi Chapel PA-C, After hours and holidays please check [email or web address] for group call information for [facility]  Noemi Chapel, PA-C 12/17/2020

## 2020-12-17 NOTE — NC FL2 (Signed)
[place] LEVEL OF CARE SCREENING TOOL     IDENTIFICATION  Patient Name: Dominique Brooks Birthdate: 01/28/1951 Sex: female Admission Date (Current Location): 12/16/2020  [facility] [place] and [place] Number:  Herbalist and Address:  [facility],  [place], [place]      Provider Number: [id number]  Attending Physician Name and Address:  Hiram Gash, MD  Relative Name and Phone Number:  daughter, Veronia Beets @ [phone]    Current Level of Care: Hospital Recommended Level of Care: [place] Prior Approval Number:    Date Approved/Denied:   PASRR Number: [id number]  Discharge Plan: SNF    Current Diagnoses: Patient Active Problem List   Diagnosis Date Noted   Closed fracture dislocation of left ankle 12/16/2020   Constipation 04/28/2016   History of colonic polyps 04/28/2016    Orientation RESPIRATION BLADDER Height & Weight     Self, Time, Situation, Place  Normal Continent Weight: 180 lb (81.6 kg) Height:  5' 6.5" (168.9 cm)  BEHAVIORAL SYMPTOMS/MOOD NEUROLOGICAL BOWEL NUTRITION STATUS      Continent    AMBULATORY STATUS COMMUNICATION OF NEEDS Skin   Limited Assist Verbally Surgical wounds                       Personal Care Assistance Level of Assistance  Bathing, Dressing Bathing Assistance: Limited assistance   Dressing Assistance: Limited assistance     Functional Limitations Info             SPECIAL CARE FACTORS FREQUENCY  PT (By licensed PT), OT (By licensed OT)     PT Frequency: 5x/wk OT Frequency: 5x/wk            Contractures Contractures Info: Not present    Additional Factors Info  Code Status, Allergies Code Status Info: Full Allergies Info: sulfa antibiotics           Current Medications (12/17/2020):  This is the current hospital active medication list Current Facility-Administered Medications  Medication Dose Route Frequency Provider Last Rate  Last Admin   acetaminophen (TYLENOL) tablet 1,000 mg  1,000 mg Oral Q8H McBane, Caroline N, PA-C   500 mg at 12/17/20 0855   ALPRAZolam Duanne Moron) tablet 0.25-0.5 mg  0.25-0.5 mg Oral BID PRN Ethelda Chick, PA-C   0.25 mg at 12/16/20 2204   bisacodyl (DULCOLAX) EC tablet 5 mg  5 mg Oral Daily PRN McBane, Maylene Roes, PA-C       citalopram (CELEXA) tablet 20 mg  20 mg Oral Daily Ethelda Chick, PA-C   20 mg at 12/17/20 0854   diphenhydrAMINE (BENADRYL) 12.5 MG/5ML elixir 12.5-25 mg  12.5-25 mg Oral Q4H PRN McBane, Maylene Roes, PA-C       docusate sodium (COLACE) capsule 100 mg  100 mg Oral BID Ethelda Chick, PA-C   100 mg at 12/17/20 0854   enoxaparin (LOVENOX) injection 40 mg  40 mg Subcutaneous Q24H Ethelda Chick, PA-C   40 mg at 12/17/20 0857   HYDROmorphone (DILAUDID) injection 0.25-0.5 mg  0.25-0.5 mg Intravenous Q4H PRN McBane, Maylene Roes, PA-C       lactated ringers 1,000 mL with potassium chloride 20 mEq infusion   Intravenous Continuous McBane, Maylene Roes, PA-C   Stopped at 12/16/20 1802   metoCLOPramide (REGLAN) tablet 5-10 mg  5-10 mg Oral Q8H PRN McBane, Maylene Roes, PA-C       Or   metoCLOPramide (REGLAN) injection 5-10  mg  5-10 mg Intravenous Q8H PRN McBane, Maylene Roes, PA-C       naproxen (NAPROSYN) tablet 250 mg  250 mg Oral BID WC McBane, Caroline N, PA-C   250 mg at 12/17/20 0855   ondansetron (ZOFRAN) tablet 4 mg  4 mg Oral Q6H PRN Ethelda Chick, PA-C       Or   ondansetron (ZOFRAN) injection 4 mg  4 mg Intravenous Q6H PRN McBane, Maylene Roes, PA-C       oxyCODONE (Oxy IR/ROXICODONE) immediate release tablet 10 mg  10 mg Oral Q6H PRN McBane, Maylene Roes, PA-C       oxyCODONE (Oxy IR/ROXICODONE) immediate release tablet 2.5-5 mg  2.5-5 mg Oral Q4H PRN Ethelda Chick, PA-C   2.5 mg at 12/17/20 1228   pantoprazole (PROTONIX) EC tablet 40 mg  40 mg Oral Daily Ethelda Chick, PA-C   40 mg at 12/17/20 [id number]   polyethylene glycol (MIRALAX / GLYCOLAX) packet 17 g   17 g Oral Daily PRN McBane, Maylene Roes, PA-C       sodium phosphate (FLEET) 7-19 GM/118ML enema 1 enema  1 enema Rectal Once PRN McBane, Maylene Roes, PA-C       traZODone (DESYREL) tablet 50-100 mg  50-100 mg Oral QHS Ethelda Chick, PA-C         Discharge Medications: Please see discharge summary for a list of discharge medications.  Relevant Imaging Results:  Relevant Lab Results:   Additional Information SS# [id number];  has had COVID vaccine x 2  Aquinnah Devin, LCSW

## 2020-12-17 NOTE — Progress Notes (Signed)
Occupational Therapy Evaluation  Patient lives alone in apartment with multiple steps to enter. At baseline is very independent. Currently patient with L upper and lower extremity impairments due to weight bearing restrictions impacting her overall balance and standing tolerance. Patient needing set up to min A for upper body and mod to max A for lower body self care tasks. Patient needing min A x2 with bed height elevated to power up to standing with platform walker as well as verbal cues for body mechanics and sequencing hop with R LE in order to maintain TWB L LE. Due to limited assistance available at D/C and decreased independence with self care would recommend short term rehab prior to returning home, acute OT to follow.     12/17/20 1400  OT Visit Information  Last OT Received On 12/17/20  Assistance Needed +2  PT/OT/SLP Co-Evaluation/Treatment Yes  Reason for Co-Treatment To address functional/ADL transfers;For patient/therapist safety  PT goals addressed during session Mobility/safety with mobility  OT goals addressed during session ADL's and self-care  History of Present Illness Pt s/p fall with L wrist and ankle fxs and now s/p ORIFs.  Precautions  Precautions Fall  Restrictions  Weight Bearing Restrictions Yes  LUE Weight Bearing Weight bear through elbow only  LLE Weight Bearing TWB  Home Living  Family/patient expects to be discharged to: [place] Alone  Available Help at Discharge Family;Available PRN/intermittently  Type of Home Apartment  Home Access Stairs to enter  Entrance Stairs-Number of Steps 4-5 then 1 step to patio then another into apartment (sits up on hill)  Home Layout One level  Bathroom Shower/Tub Tub/shower unit  Tax adviser - 4 wheels;BSC  Prior Function  Level of Independence Independent  Communication  Communication No difficulties  Pain Assessment  Pain Assessment Faces   Faces Pain Scale 4  Pain Location wrist more than ankle  Pain Descriptors / Indicators Aching;Sore  Pain Intervention(s) Limited activity within patient's tolerance  Cognition  Arousal/Alertness Awake/alert  Behavior During Therapy WFL for tasks assessed/performed  Overall Cognitive Status Within Functional Limits for tasks assessed  Upper Extremity Assessment  Upper Extremity Assessment LUE deficits/detail  LUE Deficits / Details cast with patient's personal brace from MPs to mid forearm. Able to range PIP and DIP joints of all 5 digits in L hand  Lower Extremity Assessment  Lower Extremity Assessment Defer to PT evaluation  Cervical / Trunk Assessment  Cervical / Trunk Assessment Normal  ADL  Overall ADL's  Needs assistance/impaired  Eating/Feeding Independent;Bed level  Eating/Feeding Details (indicate cue type and reason) able to drink from cup  Grooming Set up;Sitting  Upper Body Bathing Minimal assistance;Sitting  Lower Body Bathing Moderate assistance;Sitting/lateral leans;Sit to/from stand  Upper Body Dressing  Minimal assistance;Sitting  Lower Body Dressing Maximal assistance;Sitting/lateral leans;Sit to/from Retail buyer Minimal assistance;+2 for physical assistance;+2 for safety/equipment;Cueing for safety;Cueing for sequencing;RW  Toilet Transfer Details (indicate cue type and reason) min x2 to power up to standing from elevated bed height. cues for body mechanics and how to maintain weight bearing restrictions to L LE/UE. min A x1-2 to stabilize while stepping with R LE to advance walker  Toileting- Clothing Manipulation and Hygiene Maximal assistance;Sitting/lateral lean;Sit to/from stand  Functional mobility during ADLs Minimal assistance;+2 for physical assistance;+2 for safety/equipment;Cueing for safety;Cueing for sequencing;Rolling walker  General ADL Comments patient needing increased assistance for self care tasks due to WB restrictions, limited activity  tolerance, balance  Bed Mobility  Overal bed mobility Needs Assistance  Bed Mobility Supine to Sit  Supine to sit Min guard  General bed mobility comments safety  Transfers  Overall transfer level Needs assistance  Equipment used Rolling walker (2 wheeled);Left platform walker  Transfers Sit to/from Stand  Sit to Stand Min assist;+2 physical assistance;+2 safety/equipment;From elevated surface  General transfer comment cues for LE management and use of UEs to self assist.  Physical assist to bring wt up and fwd and to balance in initial standing  Balance  Overall balance assessment Needs assistance;History of Falls  Sitting-balance support No upper extremity supported;Feet supported  Sitting balance-Leahy Scale Good  Standing balance support Bilateral upper extremity supported  Standing balance-Leahy Scale Poor  Standing balance comment reliant on walker and external assistance  OT - End of Session  Equipment Utilized During Treatment Gait belt;Other (comment) (platform walker)  Activity Tolerance Patient tolerated treatment well  Patient left in chair;with call bell/phone within reach;with family/visitor present  Nurse Communication Mobility status  OT Assessment  OT Recommendation/Assessment Patient needs continued OT Services  OT Visit Diagnosis Unsteadiness on feet (R26.81);Other abnormalities of gait and mobility (R26.89);History of falling (Z91.81);Pain  Pain - Right/Left Left  Pain - part of body Ankle and joints of foot (wrist)  OT Problem List Decreased strength;Decreased activity tolerance;Impaired balance (sitting and/or standing);Decreased safety awareness;Decreased knowledge of use of DME or AE;Pain;Impaired UE functional use  Barriers to Discharge Decreased caregiver support  Barriers to Discharge Comments family unable to provide 24/7 assistance at D/C  OT Plan  OT Frequency (ACUTE ONLY) Min 2X/week  OT Treatment/Interventions (ACUTE ONLY) Self-care/ADL training;DME  and/or AE instruction;Therapeutic activities;Patient/family education;Balance training  AM-PAC OT "6 Clicks" Daily Activity Outcome Measure (Version 2)  Help from another person eating meals? 4  Help from another person taking care of personal grooming? 3  Help from another person toileting, which includes using toliet, bedpan, or urinal? 2  Help from another person bathing (including washing, rinsing, drying)? 2  Help from another person to put on and taking off regular upper body clothing? 3  Help from another person to put on and taking off regular lower body clothing? 2  6 Click Score 16  Progressive Mobility  What is the highest level of mobility based on the progressive mobility assessment? Level 4 (Walks with assist in room) - Balance while marching in place and cannot step forward and back - Complete  Mobility Ambulated with assistance in room;Out of bed to chair with meals  OT Recommendation  Follow Up Recommendations SNF  OT Equipment Tub/shower seat  Individuals Consulted  Consulted and Agree with Results and Recommendations Patient  Acute Rehab OT Goals  Patient Stated Goal Rehab to regain IND and then home  OT Goal Formulation With patient  Time For Goal Achievement 12/31/20  Potential to Achieve Goals Good  OT Time Calculation  OT Start Time (ACUTE ONLY) 1019  OT Stop Time (ACUTE ONLY) 1049  OT Time Calculation (min) 30 min  OT General Charges  $OT Visit 1 Visit  OT Evaluation  $OT Eval Low Complexity 1 Low  Written Expression  Dominant Hand Left   Delbert Phenix OT OT pager: [phone]

## 2020-12-17 NOTE — Evaluation (Signed)
Physical Therapy Evaluation Patient Details Name: Dominique Brooks MRN: [id number] DOB: 31-Aug-1950 Today's Date: 12/17/2020   History of Present Illness  Pt s/p fall with L wrist and ankle fxs and now s/p ORIFs.  Clinical Impression  Pt admitted as above and presenting with functional mobility limitations 2* limited WB/strength/ROM L UE and LE; post op pain; and ambulatory balance deficits.  Pt would benefit from follow up rehab at SNF level to maximize IND and safety prior to dc home ALONE.    Follow Up Recommendations SNF    Equipment Recommendations  Rolling walker with 5" wheels (with L platform attachment)    Recommendations for Other Services       Precautions / Restrictions Precautions Precautions: Fall Restrictions Weight Bearing Restrictions: Yes LUE Weight Bearing: Weight bearing as tolerated LLE Weight Bearing: Touchdown weight bearing Other Position/Activity Restrictions: L UE WBAT on elbow      Mobility  Bed Mobility Overal bed mobility: Needs Assistance Bed Mobility: Supine to Sit     Supine to sit: Min guard     General bed mobility comments: safety    Transfers Overall transfer level: Needs assistance Equipment used: Rolling walker (2 wheeled);Left platform walker Transfers: Sit to/from Stand Sit to Stand: Min assist;Mod assist;+2 physical assistance;+2 safety/equipment         General transfer comment: cues for LE management and use of UEs to self assist.  Physical assist to bring wt up and fwd and to balance in initial standing  Ambulation/Gait Ambulation/Gait assistance: Min assist;+2 physical assistance;+2 safety/equipment Gait Distance (Feet): 17 Feet Assistive device: Left platform walker;Rolling walker (2 wheeled) Gait Pattern/deviations: Step-to pattern;Shuffle;Trunk flexed Gait velocity: decr   General Gait Details: cues for sequence, posture, position from RW and TDWB  Stairs            Wheelchair Mobility    Modified  [place] (Stroke Patients Only)       Balance Overall balance assessment: Needs assistance Sitting-balance support: No upper extremity supported;Feet supported Sitting balance-Leahy Scale: Good     Standing balance support: Bilateral upper extremity supported Standing balance-Leahy Scale: Poor                               Pertinent Vitals/Pain Pain Assessment: Faces Faces Pain Scale: Hurts little more Pain Location: wrist more than ankle Pain Descriptors / Indicators: Aching;Sore Pain Intervention(s): Limited activity within patient's tolerance;Monitored during session;Premedicated before session    Home Living Family/patient expects to be discharged to:: Skilled nursing facility Living Arrangements: Alone Available Help at Discharge: Family;Available PRN/intermittently Type of Home: Apartment Home Access: Stairs to enter   Entrance Stairs-Number of Steps: 4-5 then 1 step to patio then another into apartment (sits up on hill) Home Layout: One level Home Equipment: Walker - 4 wheels;Bedside commode      Prior Function Level of Independence: Independent               Hand Dominance   Dominant Hand: Left    Extremity/Trunk Assessment   Upper Extremity Assessment Upper Extremity Assessment: LUE deficits/detail;Defer to OT evaluation    Lower Extremity Assessment Lower Extremity Assessment: LLE deficits/detail LLE Deficits / Details: Hip and knee WFL; ankle immobilizeed    Cervical / Trunk Assessment Cervical / Trunk Assessment: Normal  Communication   Communication: No difficulties  Cognition Arousal/Alertness: Awake/alert Behavior During Therapy: WFL for tasks assessed/performed Overall Cognitive Status: Within Functional Limits for tasks assessed  General Comments      Exercises     Assessment/Plan    PT Assessment Patient needs continued PT services  PT Problem List Decreased  strength;Decreased range of motion;Decreased activity tolerance;Decreased balance;Decreased mobility;Decreased knowledge of use of DME;Pain       PT Treatment Interventions DME instruction;Gait training;Functional mobility training;Therapeutic activities;Therapeutic exercise;Patient/family education    PT Goals (Current goals can be found in the Care Plan section)  Acute Rehab PT Goals Patient Stated Goal: Rehab to regain IND and then home PT Goal Formulation: With patient Time For Goal Achievement: 12/31/20 Potential to Achieve Goals: Good    Frequency Min 5X/week   Barriers to discharge Decreased caregiver support Pt lives alone and does not have 24/7 available    Co-evaluation PT/OT/SLP Co-Evaluation/Treatment: Yes Reason for Co-Treatment: For patient/therapist safety;To address functional/ADL transfers PT goals addressed during session: Mobility/safety with mobility OT goals addressed during session: ADL's and self-care       AM-PAC PT "6 Clicks" Mobility  Outcome Measure Help needed turning from your back to your side while in a flat bed without using bedrails?: A Little Help needed moving from lying on your back to sitting on the side of a flat bed without using bedrails?: A Little Help needed moving to and from a bed to a chair (including a wheelchair)?: A Lot Help needed standing up from a chair using your arms (e.g., wheelchair or bedside chair)?: A Lot Help needed to walk in hospital room?: A Lot Help needed climbing 3-5 steps with a railing? : Total 6 Click Score: 13    End of Session Equipment Utilized During Treatment: Gait belt Activity Tolerance: Patient tolerated treatment well;Patient limited by fatigue Patient left: in chair;with call bell/phone within reach;with chair alarm set;with family/visitor present Nurse Communication: Mobility status PT Visit Diagnosis: Unsteadiness on feet (R26.81);History of falling (Z91.81);Difficulty in walking, not elsewhere  classified (R26.2);Pain Pain - Right/Left: Left Pain - part of body: Arm;Ankle and joints of foot    Time: [id number] PT Time Calculation (min) (ACUTE ONLY): 28 min   Charges:   PT Evaluation $PT Eval Low Complexity: 1 Low          [place] Pager [phone] Office [phone]   Vickie Melnik 12/17/2020, 12:05 PM

## 2020-12-17 NOTE — Plan of Care (Signed)
  Problem: Education: Goal: Knowledge of General Education information will improve Description Including pain rating scale, medication(s)/side effects and non-pharmacologic comfort measures Outcome: Progressing   Problem: Health Behavior/Discharge Planning: Goal: Ability to manage health-related needs will improve Outcome: Progressing   

## 2020-12-17 NOTE — TOC Initial Note (Signed)
Transition of Care [facility]) - Initial/Assessment Note    Patient Details  Name: Dominique Brooks MRN: [id number] Date of Birth: 1951-01-21  [facility] CM/SW Contact:    Lennart Pall, LCSW Phone Number: 12/17/2020, 12:57 PM  Clinical Narrative:                 Met with pt and daughter today to introduce self/ TOC role with dc planning.  Both are aware and agree with PT recommendation for short term SNF rehab prior to home dc.  Pt very motivated to regain her prior level of independence and family is very supportive.  Will begin SNF bed search and insurance authorization.  Expected Discharge Plan: Skilled Nursing Facility Barriers to Discharge: Ship broker, Continued Medical Work up   Patient Goals and CMS Choice Patient states their goals for this hospitalization and ongoing recovery are:: return home following SNF rehab      Expected Discharge Plan and Services Expected Discharge Plan: [place] In-house Referral: Clinical Social Work     Living arrangements for the past 2 months: Apartment                 DME Arranged: N/A DME Agency: NA                  Prior Living Arrangements/Services Living arrangements for the past 2 months: Apartment Lives with:: Self Patient language and need for interpreter reviewed:: Yes Do you feel safe going back to the place where you live?: Yes      Need for Family Participation in Patient Care: No (Comment) Care giver support system in place?: Yes (comment)   Criminal Activity/Legal Involvement Pertinent to Current Situation/Hospitalization: No - Comment as needed  Activities of Daily Living Home Assistive Devices/Equipment: Eyeglasses ADL Screening (condition at time of admission) Patient's cognitive ability adequate to safely complete daily activities?: Yes Is the patient deaf or have difficulty hearing?: No Does the patient have difficulty seeing, even when wearing glasses/contacts?: No Does  the patient have difficulty concentrating, remembering, or making decisions?: No Patient able to express need for assistance with ADLs?: Yes Does the patient have difficulty dressing or bathing?: No Independently performs ADLs?: Yes (appropriate for developmental age) Does the patient have difficulty walking or climbing stairs?: No Weakness of Legs: None Weakness of Arms/Hands: None  Permission Sought/Granted Permission sought to share information with : Family Supports, Chartered certified accountant granted to share information with : Yes, Verbal Permission Granted  Share Information with NAME: Veronia Beets     Permission granted to share info w Relationship: daughter  Permission granted to share info w Contact Information: [phone]  Emotional Assessment Appearance:: Appears stated age Attitude/Demeanor/Rapport: Gracious, Charismatic, Engaged Affect (typically observed): Accepting Orientation: : Oriented to Self, Oriented to Place, Oriented to  Time, Oriented to Situation Alcohol / Substance Use: Not Applicable Psych Involvement: No (comment)  Admission diagnosis:  Closed fracture dislocation of left ankle [S82.892A] Patient Active Problem List   Diagnosis Date Noted   Closed fracture dislocation of left ankle 12/16/2020   Constipation 04/28/2016   History of colonic polyps 04/28/2016   PCP:  Caryl Bis, MD Pharmacy:   [facility], [place] [address] [place] [address] Phone: [phone] Fax: [phone]     Social Determinants of Health (SDOH) Interventions    Readmission Risk Interventions No flowsheet data found.

## 2020-12-18 DIAGNOSIS — M25512 Pain in left shoulder: Secondary | ICD-10-CM | POA: Diagnosis not present

## 2020-12-18 DIAGNOSIS — S62102D Fracture of unspecified carpal bone, left wrist, subsequent encounter for fracture with routine healing: Secondary | ICD-10-CM | POA: Diagnosis not present

## 2020-12-18 DIAGNOSIS — Z882 Allergy status to sulfonamides status: Secondary | ICD-10-CM | POA: Diagnosis not present

## 2020-12-18 DIAGNOSIS — Z9181 History of falling: Secondary | ICD-10-CM | POA: Diagnosis not present

## 2020-12-18 DIAGNOSIS — R2681 Unsteadiness on feet: Secondary | ICD-10-CM | POA: Diagnosis not present

## 2020-12-18 DIAGNOSIS — K029 Dental caries, unspecified: Secondary | ICD-10-CM | POA: Diagnosis not present

## 2020-12-18 DIAGNOSIS — S82892D Other fracture of left lower leg, subsequent encounter for closed fracture with routine healing: Secondary | ICD-10-CM | POA: Diagnosis not present

## 2020-12-18 DIAGNOSIS — E569 Vitamin deficiency, unspecified: Secondary | ICD-10-CM | POA: Diagnosis not present

## 2020-12-18 DIAGNOSIS — F5101 Primary insomnia: Secondary | ICD-10-CM | POA: Diagnosis not present

## 2020-12-18 DIAGNOSIS — R531 Weakness: Secondary | ICD-10-CM | POA: Diagnosis not present

## 2020-12-18 DIAGNOSIS — J309 Allergic rhinitis, unspecified: Secondary | ICD-10-CM | POA: Diagnosis not present

## 2020-12-18 DIAGNOSIS — Z7401 Bed confinement status: Secondary | ICD-10-CM | POA: Diagnosis not present

## 2020-12-18 DIAGNOSIS — K5903 Drug induced constipation: Secondary | ICD-10-CM | POA: Diagnosis not present

## 2020-12-18 DIAGNOSIS — S6292XA Unspecified fracture of left wrist and hand, initial encounter for closed fracture: Secondary | ICD-10-CM | POA: Diagnosis not present

## 2020-12-18 DIAGNOSIS — Z20822 Contact with and (suspected) exposure to covid-19: Secondary | ICD-10-CM | POA: Diagnosis not present

## 2020-12-18 DIAGNOSIS — S52502D Unspecified fracture of the lower end of left radius, subsequent encounter for closed fracture with routine healing: Secondary | ICD-10-CM | POA: Diagnosis not present

## 2020-12-18 DIAGNOSIS — S82842A Displaced bimalleolar fracture of left lower leg, initial encounter for closed fracture: Secondary | ICD-10-CM | POA: Diagnosis not present

## 2020-12-18 DIAGNOSIS — S9305XA Dislocation of left ankle joint, initial encounter: Secondary | ICD-10-CM | POA: Diagnosis not present

## 2020-12-18 DIAGNOSIS — S82842D Displaced bimalleolar fracture of left lower leg, subsequent encounter for closed fracture with routine healing: Secondary | ICD-10-CM | POA: Diagnosis not present

## 2020-12-18 DIAGNOSIS — Z85828 Personal history of other malignant neoplasm of skin: Secondary | ICD-10-CM | POA: Diagnosis not present

## 2020-12-18 DIAGNOSIS — K219 Gastro-esophageal reflux disease without esophagitis: Secondary | ICD-10-CM | POA: Diagnosis not present

## 2020-12-18 DIAGNOSIS — Z79899 Other long term (current) drug therapy: Secondary | ICD-10-CM | POA: Diagnosis not present

## 2020-12-18 LAB — CBC
HCT: 33.9 % — ABNORMAL LOW (ref 36.0–46.0)
Hemoglobin: 10.8 g/dL — ABNORMAL LOW (ref 12.0–15.0)
MCH: 30.2 pg (ref 26.0–34.0)
MCHC: 31.9 g/dL (ref 30.0–36.0)
MCV: 94.7 fL (ref 80.0–100.0)
Platelets: 285 10*3/uL (ref 150–400)
RBC: 3.58 MIL/uL — ABNORMAL LOW (ref 3.87–5.11)
RDW: 12.8 % (ref 11.5–15.5)
WBC: 8.1 10*3/uL (ref 4.0–10.5)
nRBC: 0 % (ref 0.0–0.2)

## 2020-12-18 LAB — RESP PANEL BY RT-PCR (FLU A&B, COVID) ARPGX2
Influenza A by PCR: NEGATIVE
Influenza B by PCR: NEGATIVE
SARS Coronavirus 2 by RT PCR: NEGATIVE

## 2020-12-18 MED ORDER — ACETAMINOPHEN 500 MG PO TABS: 1000.0000 mg | ORAL_TABLET | Freq: Three times a day (TID) | ORAL | 0 refills | Status: AC

## 2020-12-18 MED ORDER — OXYCODONE HCL 5 MG PO TABS: ORAL_TABLET | ORAL | 0 refills | Status: AC

## 2020-12-18 MED ORDER — APIXABAN 2.5 MG PO TABS: 2.5000 mg | ORAL_TABLET | Freq: Two times a day (BID) | ORAL | 0 refills | Status: AC

## 2020-12-18 NOTE — Progress Notes (Signed)
Called facility and gave report to [facility]. All questions answered.  Pt not in acute distress, daughter at bedside. Pt to discharge to facility with belongings via [place].

## 2020-12-18 NOTE — Plan of Care (Signed)
  Problem: Coping: Goal: Level of anxiety will decrease Outcome: Progressing   Problem: Pain Managment: Goal: General experience of comfort will improve Outcome: Progressing   Problem: Safety: Goal: Ability to remain free from injury will improve Outcome: Progressing   Problem: Skin Integrity: Goal: Risk for impaired skin integrity will decrease Outcome: Progressing   

## 2020-12-18 NOTE — Discharge Summary (Signed)
Patient ID: Dominique Brooks MRN: [id number] DOB/AGE: 71-Nov-1952 71 y.o.  Admit date: 12/16/2020 Discharge date: 12/18/2020  Admission Diagnoses: Fracture dislocation left ankle, left wrist fracture  Discharge Diagnoses:  Active Problems:   Closed fracture dislocation of left ankle   Past Medical History:  Diagnosis Date   Arthritis    Constipation 04/28/2016   GERD (gastroesophageal reflux disease)    Heart murmur    Functional murmur since birth   Rotator cuff tear    Right   Skin cancer    Basal cell    Procedures Performed:  OPEN REDUCTION INTERNAL FIXATION (ORIF) DISTAL RADIAL FRACTURE OPEN REDUCTION INTERNAL FIXATION (ORIF) ANKLE FRACTURE  Discharged Condition: good  Hospital Course: Patient brought in to [facility] for scheduled procedure. She was kept for monitoring overnight for pain control, medical monitoring postop, PT/OT evaluations, and discharge planning. She was found to be stable for DC to SNF on post-op day #2.  Patient was instructed on specific activity restrictions and all questions were answered.  Consults: None  Significant Diagnostic Studies: No additional pertinent studies  Treatments: Surgery  Discharge Exam: General: resting in hospital bed, NAD Cardiac: regular rate Pulmonary: no increased work of breathing Left upper extremity: dressing CDI. Skin intact though cannot assess fully beneath splint. Nontender to palpation proximally. Flexes and extends all fingers. Sensation intact in medial, radial, and ulnar distributions. Well perfused digits.  Left lower extremity: splint CDI, +EHL though remainder of motor difficult to test due to splint, sensation intact distally with warm well perfused foot, no pain w passive stretch  Disposition: Discharge disposition: 03-Skilled [place]    SNF  Discharge Instructions     Call MD for:  redness, tenderness, or signs of infection (pain, swelling, redness, odor or green/yellow  discharge around incision site)   Complete by: As directed    Call MD for:  severe uncontrolled pain   Complete by: As directed    Call MD for:  temperature >100.4   Complete by: As directed    Diet - low sodium heart healthy   Complete by: As directed       Allergies as of 12/18/2020       Reactions   Sulfa Antibiotics    Flushing, red. Was 70 years ago per pt         Medication List     STOP taking these medications    HYDROcodone-acetaminophen 7.5-325 MG tablet Commonly known as: NORCO   traMADol 50 MG tablet Commonly known as: ULTRAM       TAKE these medications    acetaminophen 500 MG tablet Commonly known as: TYLENOL Take 2 tablets (1,000 mg total) by mouth every 8 (eight) hours for 14 days.   ALPRAZolam 0.5 MG tablet Commonly known as: XANAX Take 0.25-0.5 mg by mouth 2 (two) times daily as needed (muscle spasms).   apixaban 2.5 MG Tabs tablet Commonly known as: ELIQUIS Take 1 tablet (2.5 mg total) by mouth 2 (two) times daily. For postoperative DVT prophylaxis   fexofenadine 180 MG tablet Commonly known as: ALLEGRA Take 180 mg by mouth daily.   FLUoxetine 40 MG capsule Commonly known as: PROZAC Take 40 mg by mouth daily.   ibuprofen 200 MG tablet Commonly known as: ADVIL Take 600 mg by mouth every 6 (six) hours as needed for mild pain.   multivitamin with minerals tablet Take 1 tablet by mouth daily.   omeprazole 20 MG capsule Commonly known as: PRILOSEC Take 20 mg by  mouth daily.   oxyCODONE 5 MG immediate release tablet Commonly known as: Oxy IR/ROXICODONE Take 1 pills every 4-6 hrs as needed for pain   SF 5000 Plus 1.1 % Crea dental cream Generic drug: sodium fluoride Take 1 application by mouth at bedtime.   traZODone 50 MG tablet Commonly known as: DESYREL Take 50 mg by mouth at bedtime as needed for sleep.   zolpidem 10 MG tablet Commonly known as: AMBIEN Take 10 mg by mouth at bedtime as needed for sleep.         Follow-up Information     Hiram Gash, MD Follow up in 1 week(s).   Specialty: Orthopedic Surgery Why: For wound re-check Contact information: [address] [place] [address] [phone]                 Suzette Flagler, Hershal Coria 12/18/2020

## 2020-12-18 NOTE — Progress Notes (Signed)
Physical Therapy Treatment Patient Details Name: Dominique Brooks MRN: [id number] DOB: 06/17/1950 Today's Date: 12/18/2020    History of Present Illness Pt s/p fall with L wrist and ankle fxs and now s/p ORIFs.    PT Comments    Pt continues very cooperative and progressing well with mobility including increased distance ambulated with good awareness of TDWB limitations and improved balance.  Pt reports probable dc to [facility] SNF this pm.  Follow Up Recommendations  SNF     Equipment Recommendations  Rolling walker with 5" wheels    Recommendations for Other Services       Precautions / Restrictions Precautions Precautions: Fall Restrictions Weight Bearing Restrictions: Yes LUE Weight Bearing: Weight bearing as tolerated LLE Weight Bearing: Touchdown weight bearing Other Position/Activity Restrictions: L UE WBAT on elbow    Mobility  Bed Mobility Overal bed mobility: Needs Assistance Bed Mobility: Supine to Sit     Supine to sit: Supervision     General bed mobility comments: safety    Transfers Overall transfer level: Needs assistance Equipment used: Rolling walker (2 wheeled);Left platform walker Transfers: Sit to/from Stand Sit to Stand: Min assist         General transfer comment: cues for LE management and use of UEs to self assist.  Physical assist to bring wt up and fwd and to balance in initial standing  Ambulation/Gait Ambulation/Gait assistance: Min assist Gait Distance (Feet): 43 Feet Assistive device: Left platform walker;Rolling walker (2 wheeled) Gait Pattern/deviations: Step-to pattern;Shuffle;Trunk flexed Gait velocity: decr   General Gait Details: cues for sequence, posture, position from RW and TDWB   Stairs             Wheelchair Mobility    Modified Rankin (Stroke Patients Only)       Balance Overall balance assessment: Needs assistance;History of Falls Sitting-balance support: No upper extremity supported;Feet  supported Sitting balance-Leahy Scale: Good     Standing balance support: Bilateral upper extremity supported Standing balance-Leahy Scale: Poor Standing balance comment: reliant on walker                            Cognition Arousal/Alertness: Awake/alert Behavior During Therapy: WFL for tasks assessed/performed Overall Cognitive Status: Within Functional Limits for tasks assessed                                        Exercises      General Comments        Pertinent Vitals/Pain Pain Assessment: Faces Faces Pain Scale: Hurts little more Pain Location: wrist more than ankle Pain Descriptors / Indicators: Aching;Sore Pain Intervention(s): Limited activity within patient's tolerance;Monitored during session    Home Living                      Prior Function            PT Goals (current goals can now be found in the care plan section) Acute Rehab PT Goals Patient Stated Goal: Rehab to regain IND and then home PT Goal Formulation: With patient Time For Goal Achievement: 12/31/20 Potential to Achieve Goals: Good Progress towards PT goals: Progressing toward goals    Frequency    Min 5X/week      PT Plan Current plan remains appropriate    Co-evaluation  AM-PAC PT "6 Clicks" Mobility   Outcome Measure  Help needed turning from your back to your side while in a flat bed without using bedrails?: A Little Help needed moving from lying on your back to sitting on the side of a flat bed without using bedrails?: A Little Help needed moving to and from a bed to a chair (including a wheelchair)?: A Little Help needed standing up from a chair using your arms (e.g., wheelchair or bedside chair)?: A Little Help needed to walk in hospital room?: A Lot Help needed climbing 3-5 steps with a railing? : Total 6 Click Score: 15    End of Session Equipment Utilized During Treatment: Gait belt Activity Tolerance: Patient  tolerated treatment well;Patient limited by fatigue Patient left: in chair;with call bell/phone within reach;with chair alarm set;with family/visitor present Nurse Communication: Mobility status PT Visit Diagnosis: Unsteadiness on feet (R26.81);History of falling (Z91.81);Difficulty in walking, not elsewhere classified (R26.2);Pain Pain - Right/Left: Left Pain - part of body: Arm;Ankle and joints of foot     Time: 1116-1140 PT Time Calculation (min) (ACUTE ONLY): 24 min  Charges:  $Gait Training: 23-37 mins                     Valentine Pager [phone] Office [phone]    Keaton Stirewalt 12/18/2020, 1:06 PM

## 2020-12-18 NOTE — Progress Notes (Signed)
Pt not in acute distress, PIV removed, discharged to SNF with belongings via [place].

## 2020-12-18 NOTE — TOC Transition Note (Signed)
Transition of Care [facility]) - CM/SW Discharge Note   Patient Details  Name: Dominique Brooks MRN: [id number] Date of Birth: 05-06-1951  [facility] CM/SW Contact:  Lennart Pall, LCSW Phone Number: 12/18/2020, 10:58 AM   Clinical Narrative:    Have received insurance authorization and pt has accepted SNF bed at [facility] today.  PTAR set up for 1:00 pm and RN has called report.  No further TOC needs.   Final next level of care: Skilled Nursing Facility Barriers to Discharge: Barriers Resolved   Patient Goals and CMS Choice Patient states their goals for this hospitalization and ongoing recovery are:: return home following SNF rehab      Discharge Placement PASRR number recieved: 12/17/20            Patient chooses bed at: [facility] Patient to be transferred to facility by: [facility] Name of family member notified: daughter Patient and family notified of of transfer: 12/18/20  Discharge Plan and Services In-house Referral: Clinical Social Work              DME Arranged: N/A DME Agency: NA                  Social Determinants of Health ([place]) Interventions     Readmission Risk Interventions No flowsheet data found.

## 2020-12-18 NOTE — Discharge Instructions (Signed)
Ophelia Charter MD, MPH Noemi Chapel, PA-C [place] [address] [phone] (tel)   [phone] (fax)   POST-OPERATIVE INSTRUCTIONS - WRIST  WOUND CARE You may remove the Operative Dressing on Post-Op Day #3 (72hrs after surgery).   Alternatively if you would like you can leave dressing on until follow-up if within 7-8 days but keep it dry. Leave steri-strips in place until they fall off on their own, usually 2 weeks postop. There may be a small amount of fluid/bleeding leaking at the surgical site.  This is normal You may change/reinforce the bandage as needed.  Use the Cryocuff or Ice as often as possible for the first 7 days, then as needed for pain relief. Always keep a towel, ACE wrap or other barrier between the cooling unit and your skin.  Do not soak the arm in water or submerge it.  Keep incisions as dry as possible. Do not go swimming in the pool or ocean until 4 weeks after surgery or when otherwise instructed.    BRACING:  - Once your bulky dressing is removed, use your removable wrist brace  - The brace should be used at all times except with hygiene  - Do not lift anything heavy with your operative arm   POST-OPERATIVE INSTRUCTIONS - LOWER EXTREMITY   WOUND CARE Please keep splint clean dry and intact until followup.  You may shower on Post-Op Day #3.  You must keep splint dry during this process and may find that a plastic bag taped around the leg or alternatively a towel based bath may be a better option.   If you get your splint wet or if it is damaged please contact our clinic.  EXERCISES Due to your splint being in place you will not be able to bear weight through your extremity.   DO NOT PUT ANY WEIGHT ON YOUR OPERATIVE LEG Please use crutches or a walker to avoid weight bearing.    POST-OP MEDICATIONS- Multimodal approach to pain control  In general your pain will be controlled with a combination of substances.   Prescriptions unless otherwise discussed are electronically sent to your pharmacy.  This is a carefully made plan we use to minimize narcotic use.      - Acetaminophen - Non-narcotic pain medicine taken on a scheduled basis   - Oxycodone - This is a strong narcotic, to be used only on an "as needed" basis for pain.  -  Eliquis - This medicine is used to minimize the risk of blood clots after surgery.     FOLLOW-UP If you develop a Fever (>101.5), Redness or Drainage from the surgical incision site, please call our office to arrange for an evaluation. Please call the office to schedule a follow-up appointment for your incision check if you do not already have one, 7-10 days post-operatively.  IF YOU HAVE ANY QUESTIONS, PLEASE FEEL FREE TO CALL OUR OFFICE.  HELPFUL INFORMATION  If you had a block, it will wear off between 8-24 hrs postop typically.  This is period when your pain may go from nearly zero to the pain you would have had postop without the block.  This is an abrupt transition but nothing dangerous is happening.  You may take an extra dose of narcotic when this happens.  You should wean off your narcotic medicines as soon as you are able.  Most patients will be off or using minimal narcotics before their first postop appointment.   We suggest  you use the pain medication the first night prior to going to bed, in order to ease any pain when the anesthesia wears off. You should avoid taking pain medications on an empty stomach as it will make you nauseous.  Do not drink alcoholic beverages or take illicit drugs when taking pain medications.  In most states it is against the law to drive while you are in a splint or sling.  And certainly against the law to drive while taking narcotics.  You may return to work/school in the next couple of days when you feel up to it.   Pain medication may make you constipated.  Below are a few solutions to try in this order: Decrease the amount of  pain medication if you aren't having pain. Drink lots of decaffeinated fluids. Drink prune juice and/or each dried prunes  If the first 3 don't work start with additional solutions Take Colace - an over-the-counter stool softener Take Senokot - an over-the-counter laxative Take Miralax - a stronger over-the-counter laxative  For more information including helpful videos and documents visit our website:   [email or web address]

## 2020-12-18 NOTE — Plan of Care (Signed)

## 2020-12-18 NOTE — Progress Notes (Signed)
   ORTHOPAEDIC PROGRESS NOTE  s/p Procedure(s): OPEN REDUCTION INTERNAL FIXATION (ORIF) DISTAL RADIAL FRACTURE OPEN REDUCTION INTERNAL FIXATION (ORIF) ANKLE FRACTURE  SUBJECTIVE: Reports mild pain about both operative site. Slept somewhat better last night. Worked with therapy. Motivated to get better. No chest pain. No SOB. No nausea/vomiting. No other complaints.  OBJECTIVE: PE: General: resting in hospital bed, NAD Cardiac: regular rate Pulmonary: no increased work of breathing Left upper extremity: dressing CDI. Skin intact though cannot assess fully beneath splint. Nontender to palpation proximally. Flexes and extends all fingers. Sensation intact in medial, radial, and ulnar distributions. Well perfused digits.  Left lower extremity: splint CDI, +EHL though remainder of motor difficult to test due to splint, sensation intact distally with warm well perfused foot, no pain w passive stretch   Vitals:   12/17/20 0929 12/17/20 2234  BP: 112/82 132/66  Pulse: 71 79  Resp: 18 16  Temp: 98.1 F (36.7 C) 97.8 F (36.6 C)  SpO2: 95% 94%     ASSESSMENT: Dominique Brooks is a 70 y.o. female doing well postoperatively. - s/p ORIF left wrist - s/p ORIF left ankle  - POD#2  PLAN: Weightbearing:   - LUE: NWB left wrist, WBAT through left elbow, okay to use left hand for ADLs  - LLE: Touch down weight bearing Insicional and dressing care:   - LUE: reinforce as needed  - LLE: keep splint clean and dry Orthopedic device(s):   - LUE: removable splint  - LLE: splint Showering: Hold for now VTE prophylaxis: Lovenox '40mg'$  qd Will transition to Eliquis at discharge Pain control: PRN pain medications, preferring oral medications Follow - up plan: PT/OT recommending SNF. TOC following - awaiting bed availability and insurance authorization  Contact information:  Weekdays 8-5 Dr. Ophelia Charter, Noemi Chapel PA-C, After hours and holidays please check [email or web address] for group call information  for [facility]  Patient okay for discharge to SNF once bed is available. Will continue working with PT/OT while inpatient. Discharge medications printed and placed in patient's chart.   Noemi Chapel, PA-C 12/18/2020

## 2020-12-21 DIAGNOSIS — K5903 Drug induced constipation: Secondary | ICD-10-CM | POA: Diagnosis not present

## 2020-12-21 DIAGNOSIS — F5101 Primary insomnia: Secondary | ICD-10-CM | POA: Diagnosis not present

## 2020-12-21 DIAGNOSIS — S82892D Other fracture of left lower leg, subsequent encounter for closed fracture with routine healing: Secondary | ICD-10-CM | POA: Diagnosis not present

## 2020-12-22 DIAGNOSIS — S82892D Other fracture of left lower leg, subsequent encounter for closed fracture with routine healing: Secondary | ICD-10-CM | POA: Diagnosis not present

## 2020-12-22 DIAGNOSIS — R531 Weakness: Secondary | ICD-10-CM | POA: Diagnosis not present

## 2020-12-22 DIAGNOSIS — S62102D Fracture of unspecified carpal bone, left wrist, subsequent encounter for fracture with routine healing: Secondary | ICD-10-CM | POA: Diagnosis not present

## 2020-12-24 DIAGNOSIS — S82892D Other fracture of left lower leg, subsequent encounter for closed fracture with routine healing: Secondary | ICD-10-CM | POA: Diagnosis not present

## 2020-12-24 DIAGNOSIS — M25512 Pain in left shoulder: Secondary | ICD-10-CM | POA: Diagnosis not present

## 2020-12-25 DIAGNOSIS — S82842D Displaced bimalleolar fracture of left lower leg, subsequent encounter for closed fracture with routine healing: Secondary | ICD-10-CM | POA: Diagnosis not present

## 2020-12-25 DIAGNOSIS — S52502D Unspecified fracture of the lower end of left radius, subsequent encounter for closed fracture with routine healing: Secondary | ICD-10-CM | POA: Diagnosis not present

## 2021-01-06 ENCOUNTER — Encounter (HOSPITAL_COMMUNITY): Payer: Self-pay | Admitting: Orthopaedic Surgery

## 2021-01-14 DIAGNOSIS — S82842D Displaced bimalleolar fracture of left lower leg, subsequent encounter for closed fracture with routine healing: Secondary | ICD-10-CM | POA: Diagnosis not present

## 2021-01-14 DIAGNOSIS — S52502D Unspecified fracture of the lower end of left radius, subsequent encounter for closed fracture with routine healing: Secondary | ICD-10-CM | POA: Diagnosis not present

## 2021-01-18 DIAGNOSIS — Z23 Encounter for immunization: Secondary | ICD-10-CM | POA: Diagnosis not present

## 2021-01-18 DIAGNOSIS — S5292XA Unspecified fracture of left forearm, initial encounter for closed fracture: Secondary | ICD-10-CM | POA: Diagnosis not present

## 2021-01-18 DIAGNOSIS — S82892A Other fracture of left lower leg, initial encounter for closed fracture: Secondary | ICD-10-CM | POA: Diagnosis not present

## 2021-01-19 DIAGNOSIS — R269 Unspecified abnormalities of gait and mobility: Secondary | ICD-10-CM | POA: Diagnosis not present

## 2021-01-22 DIAGNOSIS — M79651 Pain in right thigh: Secondary | ICD-10-CM | POA: Diagnosis not present

## 2021-01-22 DIAGNOSIS — M25632 Stiffness of left wrist, not elsewhere classified: Secondary | ICD-10-CM | POA: Diagnosis not present

## 2021-01-22 DIAGNOSIS — M25511 Pain in right shoulder: Secondary | ICD-10-CM | POA: Diagnosis not present

## 2021-01-22 DIAGNOSIS — Z9889 Other specified postprocedural states: Secondary | ICD-10-CM | POA: Diagnosis not present

## 2021-01-22 DIAGNOSIS — S82842D Displaced bimalleolar fracture of left lower leg, subsequent encounter for closed fracture with routine healing: Secondary | ICD-10-CM | POA: Diagnosis not present

## 2021-01-22 DIAGNOSIS — S63642D Sprain of metacarpophalangeal joint of left thumb, subsequent encounter: Secondary | ICD-10-CM | POA: Diagnosis not present

## 2021-01-22 DIAGNOSIS — Z9181 History of falling: Secondary | ICD-10-CM | POA: Diagnosis not present

## 2021-01-22 DIAGNOSIS — S52182D Other fracture of upper end of left radius, subsequent encounter for closed fracture with routine healing: Secondary | ICD-10-CM | POA: Diagnosis not present

## 2021-01-22 DIAGNOSIS — R2689 Other abnormalities of gait and mobility: Secondary | ICD-10-CM | POA: Diagnosis not present

## 2021-01-22 DIAGNOSIS — M25672 Stiffness of left ankle, not elsewhere classified: Secondary | ICD-10-CM | POA: Diagnosis not present

## 2021-01-22 DIAGNOSIS — R262 Difficulty in walking, not elsewhere classified: Secondary | ICD-10-CM | POA: Diagnosis not present

## 2021-01-22 DIAGNOSIS — G8929 Other chronic pain: Secondary | ICD-10-CM | POA: Diagnosis not present

## 2021-01-22 DIAGNOSIS — R29898 Other symptoms and signs involving the musculoskeletal system: Secondary | ICD-10-CM | POA: Diagnosis not present

## 2021-01-22 DIAGNOSIS — M25572 Pain in left ankle and joints of left foot: Secondary | ICD-10-CM | POA: Diagnosis not present

## 2021-01-27 DIAGNOSIS — Z9889 Other specified postprocedural states: Secondary | ICD-10-CM | POA: Diagnosis not present

## 2021-01-27 DIAGNOSIS — M25632 Stiffness of left wrist, not elsewhere classified: Secondary | ICD-10-CM | POA: Diagnosis not present

## 2021-01-27 DIAGNOSIS — M25672 Stiffness of left ankle, not elsewhere classified: Secondary | ICD-10-CM | POA: Diagnosis not present

## 2021-01-27 DIAGNOSIS — S82842D Displaced bimalleolar fracture of left lower leg, subsequent encounter for closed fracture with routine healing: Secondary | ICD-10-CM | POA: Diagnosis not present

## 2021-01-27 DIAGNOSIS — Z9181 History of falling: Secondary | ICD-10-CM | POA: Diagnosis not present

## 2021-01-27 DIAGNOSIS — S63642D Sprain of metacarpophalangeal joint of left thumb, subsequent encounter: Secondary | ICD-10-CM | POA: Diagnosis not present

## 2021-01-27 DIAGNOSIS — R2689 Other abnormalities of gait and mobility: Secondary | ICD-10-CM | POA: Diagnosis not present

## 2021-01-27 DIAGNOSIS — M25572 Pain in left ankle and joints of left foot: Secondary | ICD-10-CM | POA: Diagnosis not present

## 2021-01-27 DIAGNOSIS — G8929 Other chronic pain: Secondary | ICD-10-CM | POA: Diagnosis not present

## 2021-01-27 DIAGNOSIS — M25511 Pain in right shoulder: Secondary | ICD-10-CM | POA: Diagnosis not present

## 2021-01-27 DIAGNOSIS — R262 Difficulty in walking, not elsewhere classified: Secondary | ICD-10-CM | POA: Diagnosis not present

## 2021-01-27 DIAGNOSIS — R29898 Other symptoms and signs involving the musculoskeletal system: Secondary | ICD-10-CM | POA: Diagnosis not present

## 2021-01-27 DIAGNOSIS — S52182D Other fracture of upper end of left radius, subsequent encounter for closed fracture with routine healing: Secondary | ICD-10-CM | POA: Diagnosis not present

## 2021-01-27 DIAGNOSIS — M79651 Pain in right thigh: Secondary | ICD-10-CM | POA: Diagnosis not present

## 2021-02-03 DIAGNOSIS — S52182D Other fracture of upper end of left radius, subsequent encounter for closed fracture with routine healing: Secondary | ICD-10-CM | POA: Diagnosis not present

## 2021-02-03 DIAGNOSIS — M25572 Pain in left ankle and joints of left foot: Secondary | ICD-10-CM | POA: Diagnosis not present

## 2021-02-03 DIAGNOSIS — G8929 Other chronic pain: Secondary | ICD-10-CM | POA: Diagnosis not present

## 2021-02-03 DIAGNOSIS — M25511 Pain in right shoulder: Secondary | ICD-10-CM | POA: Diagnosis not present

## 2021-02-03 DIAGNOSIS — R2689 Other abnormalities of gait and mobility: Secondary | ICD-10-CM | POA: Diagnosis not present

## 2021-02-03 DIAGNOSIS — Z9181 History of falling: Secondary | ICD-10-CM | POA: Diagnosis not present

## 2021-02-03 DIAGNOSIS — M79651 Pain in right thigh: Secondary | ICD-10-CM | POA: Diagnosis not present

## 2021-02-03 DIAGNOSIS — M25632 Stiffness of left wrist, not elsewhere classified: Secondary | ICD-10-CM | POA: Diagnosis not present

## 2021-02-03 DIAGNOSIS — S82842D Displaced bimalleolar fracture of left lower leg, subsequent encounter for closed fracture with routine healing: Secondary | ICD-10-CM | POA: Diagnosis not present

## 2021-02-03 DIAGNOSIS — R29898 Other symptoms and signs involving the musculoskeletal system: Secondary | ICD-10-CM | POA: Diagnosis not present

## 2021-02-03 DIAGNOSIS — S63642D Sprain of metacarpophalangeal joint of left thumb, subsequent encounter: Secondary | ICD-10-CM | POA: Diagnosis not present

## 2021-02-03 DIAGNOSIS — Z9889 Other specified postprocedural states: Secondary | ICD-10-CM | POA: Diagnosis not present

## 2021-02-03 DIAGNOSIS — M25672 Stiffness of left ankle, not elsewhere classified: Secondary | ICD-10-CM | POA: Diagnosis not present

## 2021-02-03 DIAGNOSIS — R262 Difficulty in walking, not elsewhere classified: Secondary | ICD-10-CM | POA: Diagnosis not present

## 2021-02-05 DIAGNOSIS — R2689 Other abnormalities of gait and mobility: Secondary | ICD-10-CM | POA: Diagnosis not present

## 2021-02-05 DIAGNOSIS — M25572 Pain in left ankle and joints of left foot: Secondary | ICD-10-CM | POA: Diagnosis not present

## 2021-02-05 DIAGNOSIS — M25672 Stiffness of left ankle, not elsewhere classified: Secondary | ICD-10-CM | POA: Diagnosis not present

## 2021-02-05 DIAGNOSIS — S63642D Sprain of metacarpophalangeal joint of left thumb, subsequent encounter: Secondary | ICD-10-CM | POA: Diagnosis not present

## 2021-02-05 DIAGNOSIS — M25632 Stiffness of left wrist, not elsewhere classified: Secondary | ICD-10-CM | POA: Diagnosis not present

## 2021-02-05 DIAGNOSIS — R262 Difficulty in walking, not elsewhere classified: Secondary | ICD-10-CM | POA: Diagnosis not present

## 2021-02-05 DIAGNOSIS — Z9181 History of falling: Secondary | ICD-10-CM | POA: Diagnosis not present

## 2021-02-05 DIAGNOSIS — M79651 Pain in right thigh: Secondary | ICD-10-CM | POA: Diagnosis not present

## 2021-02-05 DIAGNOSIS — M25511 Pain in right shoulder: Secondary | ICD-10-CM | POA: Diagnosis not present

## 2021-02-05 DIAGNOSIS — G8929 Other chronic pain: Secondary | ICD-10-CM | POA: Diagnosis not present

## 2021-02-05 DIAGNOSIS — Z9889 Other specified postprocedural states: Secondary | ICD-10-CM | POA: Diagnosis not present

## 2021-02-05 DIAGNOSIS — S52182D Other fracture of upper end of left radius, subsequent encounter for closed fracture with routine healing: Secondary | ICD-10-CM | POA: Diagnosis not present

## 2021-02-05 DIAGNOSIS — S82842D Displaced bimalleolar fracture of left lower leg, subsequent encounter for closed fracture with routine healing: Secondary | ICD-10-CM | POA: Diagnosis not present

## 2021-02-05 DIAGNOSIS — R29898 Other symptoms and signs involving the musculoskeletal system: Secondary | ICD-10-CM | POA: Diagnosis not present

## 2021-02-09 DIAGNOSIS — S63642D Sprain of metacarpophalangeal joint of left thumb, subsequent encounter: Secondary | ICD-10-CM | POA: Diagnosis not present

## 2021-02-09 DIAGNOSIS — M25672 Stiffness of left ankle, not elsewhere classified: Secondary | ICD-10-CM | POA: Diagnosis not present

## 2021-02-09 DIAGNOSIS — M25572 Pain in left ankle and joints of left foot: Secondary | ICD-10-CM | POA: Diagnosis not present

## 2021-02-09 DIAGNOSIS — M25632 Stiffness of left wrist, not elsewhere classified: Secondary | ICD-10-CM | POA: Diagnosis not present

## 2021-02-09 DIAGNOSIS — G8929 Other chronic pain: Secondary | ICD-10-CM | POA: Diagnosis not present

## 2021-02-09 DIAGNOSIS — R262 Difficulty in walking, not elsewhere classified: Secondary | ICD-10-CM | POA: Diagnosis not present

## 2021-02-09 DIAGNOSIS — M25511 Pain in right shoulder: Secondary | ICD-10-CM | POA: Diagnosis not present

## 2021-02-09 DIAGNOSIS — Z9889 Other specified postprocedural states: Secondary | ICD-10-CM | POA: Diagnosis not present

## 2021-02-09 DIAGNOSIS — S52182D Other fracture of upper end of left radius, subsequent encounter for closed fracture with routine healing: Secondary | ICD-10-CM | POA: Diagnosis not present

## 2021-02-09 DIAGNOSIS — S82842D Displaced bimalleolar fracture of left lower leg, subsequent encounter for closed fracture with routine healing: Secondary | ICD-10-CM | POA: Diagnosis not present

## 2021-02-09 DIAGNOSIS — R2689 Other abnormalities of gait and mobility: Secondary | ICD-10-CM | POA: Diagnosis not present

## 2021-02-09 DIAGNOSIS — M79651 Pain in right thigh: Secondary | ICD-10-CM | POA: Diagnosis not present

## 2021-02-09 DIAGNOSIS — Z9181 History of falling: Secondary | ICD-10-CM | POA: Diagnosis not present

## 2021-02-09 DIAGNOSIS — R29898 Other symptoms and signs involving the musculoskeletal system: Secondary | ICD-10-CM | POA: Diagnosis not present

## 2021-02-10 DIAGNOSIS — S82892A Other fracture of left lower leg, initial encounter for closed fracture: Secondary | ICD-10-CM | POA: Diagnosis not present

## 2021-02-10 DIAGNOSIS — S5292XA Unspecified fracture of left forearm, initial encounter for closed fracture: Secondary | ICD-10-CM | POA: Diagnosis not present

## 2021-02-11 DIAGNOSIS — Z9889 Other specified postprocedural states: Secondary | ICD-10-CM | POA: Diagnosis not present

## 2021-02-11 DIAGNOSIS — Z9181 History of falling: Secondary | ICD-10-CM | POA: Diagnosis not present

## 2021-02-11 DIAGNOSIS — S63642D Sprain of metacarpophalangeal joint of left thumb, subsequent encounter: Secondary | ICD-10-CM | POA: Diagnosis not present

## 2021-02-11 DIAGNOSIS — M25672 Stiffness of left ankle, not elsewhere classified: Secondary | ICD-10-CM | POA: Diagnosis not present

## 2021-02-11 DIAGNOSIS — M25572 Pain in left ankle and joints of left foot: Secondary | ICD-10-CM | POA: Diagnosis not present

## 2021-02-11 DIAGNOSIS — M25632 Stiffness of left wrist, not elsewhere classified: Secondary | ICD-10-CM | POA: Diagnosis not present

## 2021-02-11 DIAGNOSIS — M25511 Pain in right shoulder: Secondary | ICD-10-CM | POA: Diagnosis not present

## 2021-02-11 DIAGNOSIS — S82842D Displaced bimalleolar fracture of left lower leg, subsequent encounter for closed fracture with routine healing: Secondary | ICD-10-CM | POA: Diagnosis not present

## 2021-02-11 DIAGNOSIS — M79651 Pain in right thigh: Secondary | ICD-10-CM | POA: Diagnosis not present

## 2021-02-11 DIAGNOSIS — R262 Difficulty in walking, not elsewhere classified: Secondary | ICD-10-CM | POA: Diagnosis not present

## 2021-02-11 DIAGNOSIS — R2689 Other abnormalities of gait and mobility: Secondary | ICD-10-CM | POA: Diagnosis not present

## 2021-02-11 DIAGNOSIS — G8929 Other chronic pain: Secondary | ICD-10-CM | POA: Diagnosis not present

## 2021-02-11 DIAGNOSIS — R29898 Other symptoms and signs involving the musculoskeletal system: Secondary | ICD-10-CM | POA: Diagnosis not present

## 2021-02-11 DIAGNOSIS — S52182D Other fracture of upper end of left radius, subsequent encounter for closed fracture with routine healing: Secondary | ICD-10-CM | POA: Diagnosis not present

## 2021-02-11 IMAGING — MR MR [PERSON_NAME]*[PERSON_NAME]* W/CM
9 series · 40 of 40 positions shown · IV contrast (agent unspecified)
Comparison: None.

CLINICAL DATA: The patient suffered a left thumb twisting injury in
November 2018. Continued pain.

EXAM:
MRI OF THE LEFT THUMB WITH CONTRAST (MR Arthrogram)
TECHNIQUE: Multiplanar, multisequence MR imaging of the left first MCP joint
was performed following intra-articular contrast injection under
fluoroscopic guidance. No intravenous contrast was administered.

[Series 7: T1 fat-sat · axial · left · 3.0mm · 0.31mm/px · z∈[-15,+44]mm · 5 of 23 slices shown (1 of 5)]
[im 1/23]
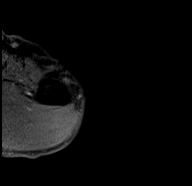
[im 6/23]
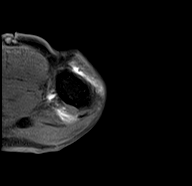
[im 12/23]
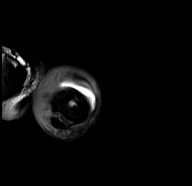
[im 17/23]
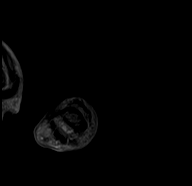
[im 23/23]
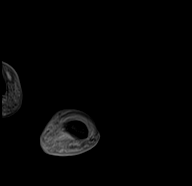

[Series 8: T2 fat-sat · axial · left · 3.0mm · 0.31mm/px · z∈[-15,+44]mm · 5 of 23 slices shown (1 of 3)]
[im 1/23]
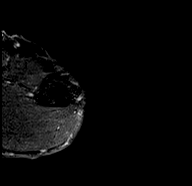
[im 6/23]
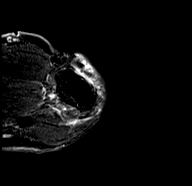
[im 12/23]
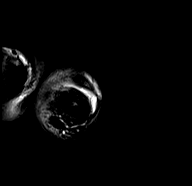
[im 17/23]
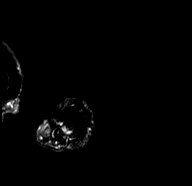
[im 23/23]
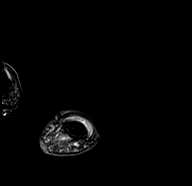

[Series 9: T1 fat-sat · coronal · left · 2.0mm · 0.31mm/px · 3 of 13 slices shown (2 of 5)]
[im 1/13]
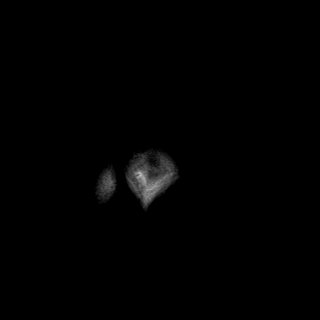
[im 7/13]
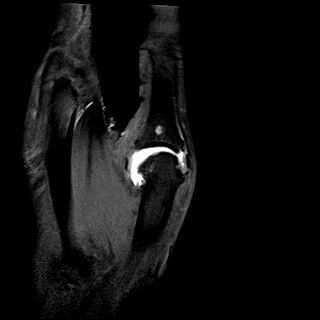
[im 13/13]
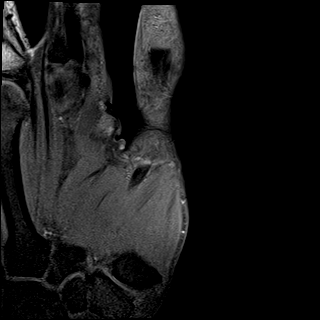

[Series 10: T2 fat-sat · coronal · left · 2.0mm · 0.31mm/px · 3 of 13 slices shown (2 of 3)]
[im 1/13]
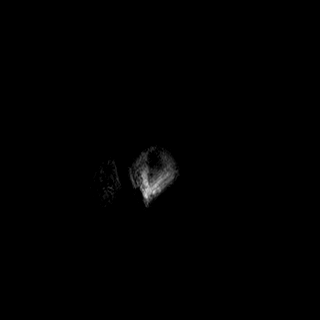
[im 7/13]
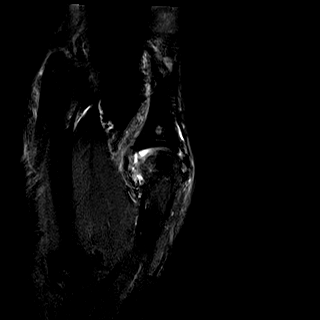
[im 13/13]
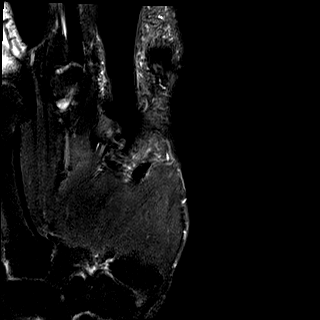

[Series 11: T1 fat-sat · oblique · left · 2.0mm · 0.31mm/px · 3 of 14 slices shown (3 of 5)]
[im 1/14]
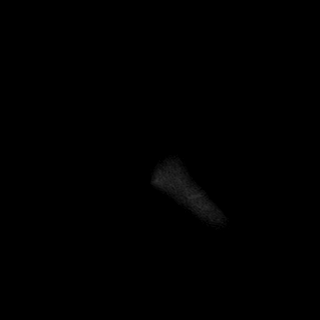
[im 7/14]
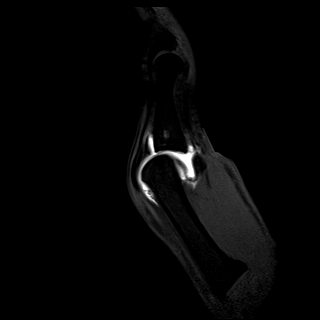
[im 14/14]
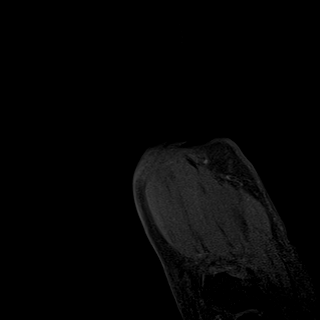

[Series 12: PD fat-sat · oblique · left · 2.0mm · 0.31mm/px · 3 of 14 slices shown]
[im 1/14]
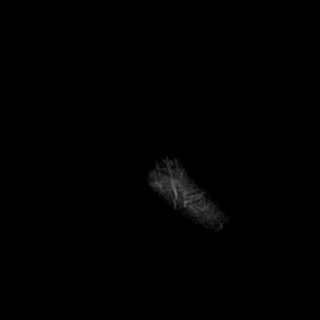
[im 7/14]
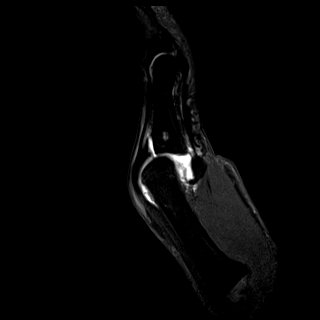
[im 14/14]
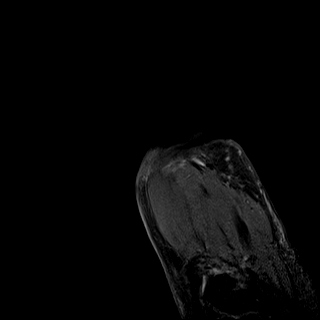

[Series 13: T2 fat-sat · coronal · left · 1.0mm · 0.31mm/px · 7 of 30 slices shown (3 of 3)]
[im 1/30]
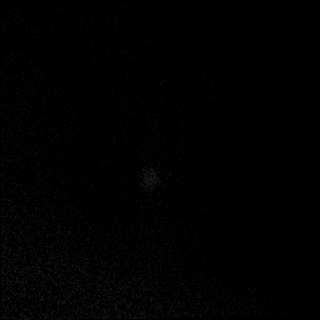
[im 5/30]
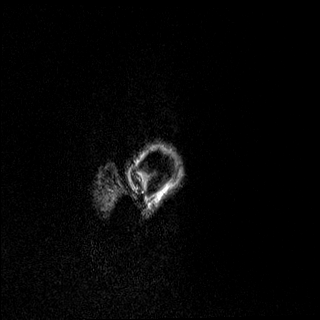
[im 10/30]
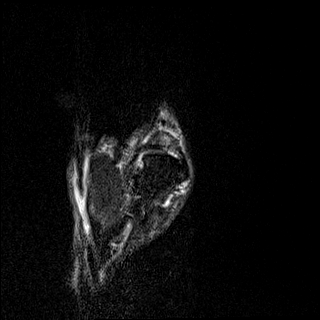
[im 15/30]
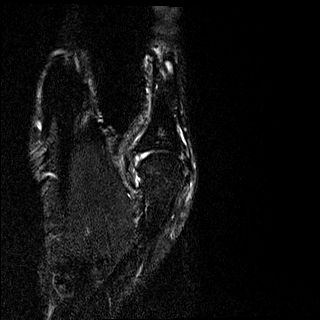
[im 20/30]
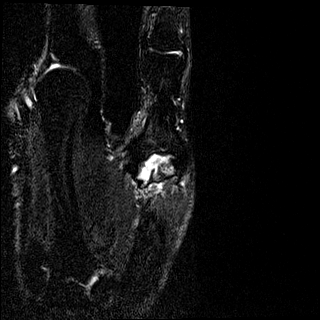
[im 25/30]
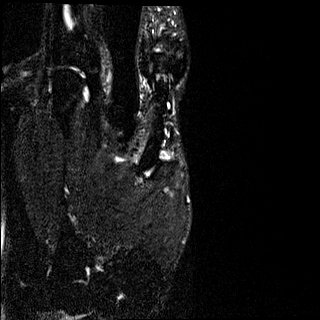
[im 30/30]
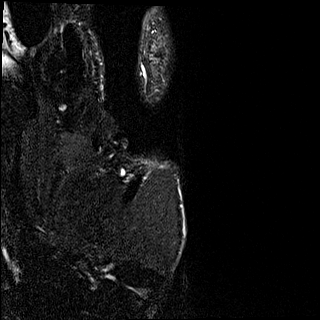

[Series 14: T1 fat-sat · axial · left · 1.0mm · 0.31mm/px · z∈[+13,+39]mm · 8 of 33 slices shown (4 of 5)]
[im 1/33]
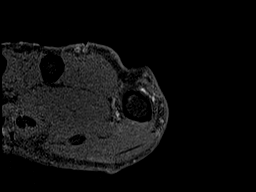
[im 5/33]
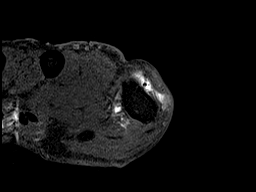
[im 10/33]
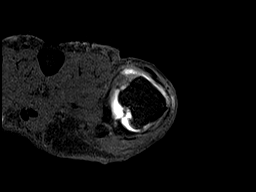
[im 14/33]
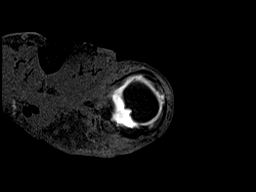
[im 19/33]
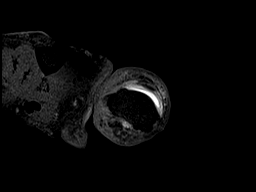
[im 23/33]
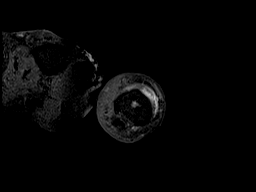
[im 28/33]
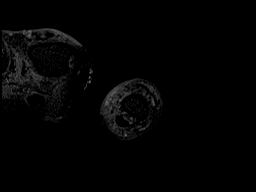
[im 33/33]
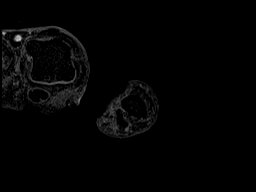

[Series 15: T1 fat-sat · coronal · left · 2.0mm · 0.31mm/px · 3 of 13 slices shown (5 of 5)]
[im 1/13]
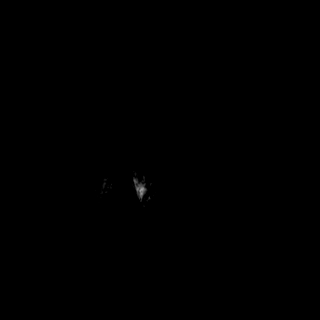
[im 7/13]
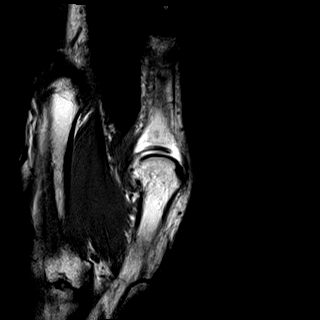
[im 13/13]
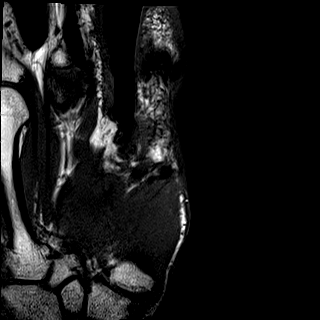

[40 of 40 positions shown; findings below may reference images not displayed]

FINDINGS: No fracture or worrisome marrow lesion is identified. There is a
tiny cyst or enchondroma in the base of the proximal phalanx of the
thumb. Mild volar subluxation of the base of the proximal phalanx of
the thumb on the head of the first metacarpal is seen. Mild
degenerative change is present about the first MCP joint and IP
joint of the thumb.

The ulnar collateral ligament of the thumb is thinned and markedly
irregular. The ligament is almost completely stripped from the head
of the first metacarpal with only a thin amount of ligament
attaching at the metaphysis. The distal attachment is intact and
there is no Stener lesion. Almost the entire ligament is irregular
and mildly edematous. The radial collateral ligament is intact.

The extensor pollicis brevis tendon is torn from the dorsal margin
of the proximal phalanx of the thumb with little to no retraction.
The volar plate and flexor tendons appear intact. No pulley injury
is identified.
IMPRESSION: Tear of the extensor pollicis brevis tendon from the base of the
proximal phalanx of the thumb with little to no retraction.

Near complete tear of the ulnar collateral ligament from the head of
the first metacarpal. The ligament is almost completely stripped off
the first metacarpal.

## 2021-02-15 DIAGNOSIS — R29898 Other symptoms and signs involving the musculoskeletal system: Secondary | ICD-10-CM | POA: Diagnosis not present

## 2021-02-15 DIAGNOSIS — M79651 Pain in right thigh: Secondary | ICD-10-CM | POA: Diagnosis not present

## 2021-02-15 DIAGNOSIS — G8929 Other chronic pain: Secondary | ICD-10-CM | POA: Diagnosis not present

## 2021-02-15 DIAGNOSIS — S52182D Other fracture of upper end of left radius, subsequent encounter for closed fracture with routine healing: Secondary | ICD-10-CM | POA: Diagnosis not present

## 2021-02-15 DIAGNOSIS — R262 Difficulty in walking, not elsewhere classified: Secondary | ICD-10-CM | POA: Diagnosis not present

## 2021-02-15 DIAGNOSIS — M25511 Pain in right shoulder: Secondary | ICD-10-CM | POA: Diagnosis not present

## 2021-02-15 DIAGNOSIS — M25572 Pain in left ankle and joints of left foot: Secondary | ICD-10-CM | POA: Diagnosis not present

## 2021-02-15 DIAGNOSIS — S63642D Sprain of metacarpophalangeal joint of left thumb, subsequent encounter: Secondary | ICD-10-CM | POA: Diagnosis not present

## 2021-02-15 DIAGNOSIS — S82842D Displaced bimalleolar fracture of left lower leg, subsequent encounter for closed fracture with routine healing: Secondary | ICD-10-CM | POA: Diagnosis not present

## 2021-02-15 DIAGNOSIS — Z9889 Other specified postprocedural states: Secondary | ICD-10-CM | POA: Diagnosis not present

## 2021-02-15 DIAGNOSIS — R2689 Other abnormalities of gait and mobility: Secondary | ICD-10-CM | POA: Diagnosis not present

## 2021-02-15 DIAGNOSIS — Z9181 History of falling: Secondary | ICD-10-CM | POA: Diagnosis not present

## 2021-02-15 DIAGNOSIS — M25672 Stiffness of left ankle, not elsewhere classified: Secondary | ICD-10-CM | POA: Diagnosis not present

## 2021-02-15 DIAGNOSIS — M25632 Stiffness of left wrist, not elsewhere classified: Secondary | ICD-10-CM | POA: Diagnosis not present

## 2021-02-16 DIAGNOSIS — S52502D Unspecified fracture of the lower end of left radius, subsequent encounter for closed fracture with routine healing: Secondary | ICD-10-CM | POA: Diagnosis not present

## 2021-02-16 DIAGNOSIS — S82842D Displaced bimalleolar fracture of left lower leg, subsequent encounter for closed fracture with routine healing: Secondary | ICD-10-CM | POA: Diagnosis not present

## 2021-02-24 DIAGNOSIS — Z9181 History of falling: Secondary | ICD-10-CM | POA: Diagnosis not present

## 2021-02-24 DIAGNOSIS — M25672 Stiffness of left ankle, not elsewhere classified: Secondary | ICD-10-CM | POA: Diagnosis not present

## 2021-02-24 DIAGNOSIS — M25572 Pain in left ankle and joints of left foot: Secondary | ICD-10-CM | POA: Diagnosis not present

## 2021-02-24 DIAGNOSIS — M25632 Stiffness of left wrist, not elsewhere classified: Secondary | ICD-10-CM | POA: Diagnosis not present

## 2021-02-24 DIAGNOSIS — Z978 Presence of other specified devices: Secondary | ICD-10-CM | POA: Diagnosis not present

## 2021-02-24 DIAGNOSIS — Z9889 Other specified postprocedural states: Secondary | ICD-10-CM | POA: Diagnosis not present

## 2021-02-24 DIAGNOSIS — R262 Difficulty in walking, not elsewhere classified: Secondary | ICD-10-CM | POA: Diagnosis not present

## 2021-02-24 DIAGNOSIS — S52182D Other fracture of upper end of left radius, subsequent encounter for closed fracture with routine healing: Secondary | ICD-10-CM | POA: Diagnosis not present

## 2021-02-24 DIAGNOSIS — S82842D Displaced bimalleolar fracture of left lower leg, subsequent encounter for closed fracture with routine healing: Secondary | ICD-10-CM | POA: Diagnosis not present

## 2021-02-26 DIAGNOSIS — M25572 Pain in left ankle and joints of left foot: Secondary | ICD-10-CM | POA: Diagnosis not present

## 2021-02-26 DIAGNOSIS — S52182D Other fracture of upper end of left radius, subsequent encounter for closed fracture with routine healing: Secondary | ICD-10-CM | POA: Diagnosis not present

## 2021-02-26 DIAGNOSIS — Z9181 History of falling: Secondary | ICD-10-CM | POA: Diagnosis not present

## 2021-02-26 DIAGNOSIS — Z9889 Other specified postprocedural states: Secondary | ICD-10-CM | POA: Diagnosis not present

## 2021-02-26 DIAGNOSIS — S82842D Displaced bimalleolar fracture of left lower leg, subsequent encounter for closed fracture with routine healing: Secondary | ICD-10-CM | POA: Diagnosis not present

## 2021-02-26 DIAGNOSIS — R262 Difficulty in walking, not elsewhere classified: Secondary | ICD-10-CM | POA: Diagnosis not present

## 2021-02-26 DIAGNOSIS — M25632 Stiffness of left wrist, not elsewhere classified: Secondary | ICD-10-CM | POA: Diagnosis not present

## 2021-02-26 DIAGNOSIS — Z978 Presence of other specified devices: Secondary | ICD-10-CM | POA: Diagnosis not present

## 2021-02-26 DIAGNOSIS — M25672 Stiffness of left ankle, not elsewhere classified: Secondary | ICD-10-CM | POA: Diagnosis not present

## 2021-03-10 DIAGNOSIS — Z23 Encounter for immunization: Secondary | ICD-10-CM | POA: Diagnosis not present

## 2021-03-10 DIAGNOSIS — S82842D Displaced bimalleolar fracture of left lower leg, subsequent encounter for closed fracture with routine healing: Secondary | ICD-10-CM | POA: Diagnosis not present

## 2021-03-10 DIAGNOSIS — R262 Difficulty in walking, not elsewhere classified: Secondary | ICD-10-CM | POA: Diagnosis not present

## 2021-03-10 DIAGNOSIS — M25632 Stiffness of left wrist, not elsewhere classified: Secondary | ICD-10-CM | POA: Diagnosis not present

## 2021-03-10 DIAGNOSIS — M25672 Stiffness of left ankle, not elsewhere classified: Secondary | ICD-10-CM | POA: Diagnosis not present

## 2021-03-10 DIAGNOSIS — Z978 Presence of other specified devices: Secondary | ICD-10-CM | POA: Diagnosis not present

## 2021-03-10 DIAGNOSIS — Z9181 History of falling: Secondary | ICD-10-CM | POA: Diagnosis not present

## 2021-03-10 DIAGNOSIS — S52182D Other fracture of upper end of left radius, subsequent encounter for closed fracture with routine healing: Secondary | ICD-10-CM | POA: Diagnosis not present

## 2021-03-10 DIAGNOSIS — M25572 Pain in left ankle and joints of left foot: Secondary | ICD-10-CM | POA: Diagnosis not present

## 2021-03-10 DIAGNOSIS — Z9889 Other specified postprocedural states: Secondary | ICD-10-CM | POA: Diagnosis not present

## 2021-03-11 DIAGNOSIS — Z1231 Encounter for screening mammogram for malignant neoplasm of breast: Secondary | ICD-10-CM | POA: Diagnosis not present

## 2021-03-17 DIAGNOSIS — E782 Mixed hyperlipidemia: Secondary | ICD-10-CM | POA: Diagnosis not present

## 2021-03-17 DIAGNOSIS — Z1159 Encounter for screening for other viral diseases: Secondary | ICD-10-CM | POA: Diagnosis not present

## 2021-03-17 DIAGNOSIS — R946 Abnormal results of thyroid function studies: Secondary | ICD-10-CM | POA: Diagnosis not present

## 2021-03-17 DIAGNOSIS — E7849 Other hyperlipidemia: Secondary | ICD-10-CM | POA: Diagnosis not present

## 2021-03-17 DIAGNOSIS — K2100 Gastro-esophageal reflux disease with esophagitis, without bleeding: Secondary | ICD-10-CM | POA: Diagnosis not present

## 2021-03-18 DIAGNOSIS — S52182A Other fracture of upper end of left radius, initial encounter for closed fracture: Secondary | ICD-10-CM | POA: Diagnosis not present

## 2021-03-18 DIAGNOSIS — R262 Difficulty in walking, not elsewhere classified: Secondary | ICD-10-CM | POA: Diagnosis not present

## 2021-03-18 DIAGNOSIS — S82892A Other fracture of left lower leg, initial encounter for closed fracture: Secondary | ICD-10-CM | POA: Diagnosis not present

## 2021-03-23 DIAGNOSIS — E7849 Other hyperlipidemia: Secondary | ICD-10-CM | POA: Diagnosis not present

## 2021-03-23 DIAGNOSIS — Z0001 Encounter for general adult medical examination with abnormal findings: Secondary | ICD-10-CM | POA: Diagnosis not present

## 2021-03-23 DIAGNOSIS — K2100 Gastro-esophageal reflux disease with esophagitis, without bleeding: Secondary | ICD-10-CM | POA: Diagnosis not present

## 2021-03-23 DIAGNOSIS — J301 Allergic rhinitis due to pollen: Secondary | ICD-10-CM | POA: Diagnosis not present

## 2021-03-23 DIAGNOSIS — G4700 Insomnia, unspecified: Secondary | ICD-10-CM | POA: Diagnosis not present

## 2021-03-23 DIAGNOSIS — R5382 Chronic fatigue, unspecified: Secondary | ICD-10-CM | POA: Diagnosis not present

## 2021-03-24 DIAGNOSIS — S82892A Other fracture of left lower leg, initial encounter for closed fracture: Secondary | ICD-10-CM | POA: Diagnosis not present

## 2021-03-24 DIAGNOSIS — S52182A Other fracture of upper end of left radius, initial encounter for closed fracture: Secondary | ICD-10-CM | POA: Diagnosis not present

## 2021-03-24 DIAGNOSIS — R262 Difficulty in walking, not elsewhere classified: Secondary | ICD-10-CM | POA: Diagnosis not present

## 2021-03-30 DIAGNOSIS — S82842D Displaced bimalleolar fracture of left lower leg, subsequent encounter for closed fracture with routine healing: Secondary | ICD-10-CM | POA: Diagnosis not present

## 2021-03-30 DIAGNOSIS — S52502D Unspecified fracture of the lower end of left radius, subsequent encounter for closed fracture with routine healing: Secondary | ICD-10-CM | POA: Diagnosis not present

## 2021-04-05 DIAGNOSIS — D040 Carcinoma in situ of skin of lip: Secondary | ICD-10-CM | POA: Diagnosis not present

## 2021-04-05 DIAGNOSIS — B078 Other viral warts: Secondary | ICD-10-CM | POA: Diagnosis not present

## 2021-04-05 DIAGNOSIS — L821 Other seborrheic keratosis: Secondary | ICD-10-CM | POA: Diagnosis not present

## 2021-04-05 DIAGNOSIS — Z85828 Personal history of other malignant neoplasm of skin: Secondary | ICD-10-CM | POA: Diagnosis not present

## 2021-04-05 DIAGNOSIS — D225 Melanocytic nevi of trunk: Secondary | ICD-10-CM | POA: Diagnosis not present

## 2021-04-15 DIAGNOSIS — J40 Bronchitis, not specified as acute or chronic: Secondary | ICD-10-CM | POA: Diagnosis not present

## 2021-05-12 DIAGNOSIS — C4402 Squamous cell carcinoma of skin of lip: Secondary | ICD-10-CM | POA: Diagnosis not present

## 2021-05-13 DIAGNOSIS — M8589 Other specified disorders of bone density and structure, multiple sites: Secondary | ICD-10-CM | POA: Diagnosis not present

## 2021-05-13 DIAGNOSIS — M810 Age-related osteoporosis without current pathological fracture: Secondary | ICD-10-CM | POA: Diagnosis not present

## 2021-06-08 DIAGNOSIS — S52502D Unspecified fracture of the lower end of left radius, subsequent encounter for closed fracture with routine healing: Secondary | ICD-10-CM | POA: Diagnosis not present

## 2021-06-08 DIAGNOSIS — S82842D Displaced bimalleolar fracture of left lower leg, subsequent encounter for closed fracture with routine healing: Secondary | ICD-10-CM | POA: Diagnosis not present

## 2021-07-23 DIAGNOSIS — E782 Mixed hyperlipidemia: Secondary | ICD-10-CM | POA: Diagnosis not present

## 2021-07-23 DIAGNOSIS — E7849 Other hyperlipidemia: Secondary | ICD-10-CM | POA: Diagnosis not present

## 2021-07-23 DIAGNOSIS — R5382 Chronic fatigue, unspecified: Secondary | ICD-10-CM | POA: Diagnosis not present

## 2021-07-23 DIAGNOSIS — Z131 Encounter for screening for diabetes mellitus: Secondary | ICD-10-CM | POA: Diagnosis not present

## 2021-07-29 DIAGNOSIS — R5382 Chronic fatigue, unspecified: Secondary | ICD-10-CM | POA: Diagnosis not present

## 2021-07-29 DIAGNOSIS — J301 Allergic rhinitis due to pollen: Secondary | ICD-10-CM | POA: Diagnosis not present

## 2021-07-29 DIAGNOSIS — I10 Essential (primary) hypertension: Secondary | ICD-10-CM | POA: Diagnosis not present

## 2021-07-29 DIAGNOSIS — K2100 Gastro-esophageal reflux disease with esophagitis, without bleeding: Secondary | ICD-10-CM | POA: Diagnosis not present

## 2021-07-29 DIAGNOSIS — E7849 Other hyperlipidemia: Secondary | ICD-10-CM | POA: Diagnosis not present

## 2021-07-29 DIAGNOSIS — G4700 Insomnia, unspecified: Secondary | ICD-10-CM | POA: Diagnosis not present

## 2021-08-17 DIAGNOSIS — M7581 Other shoulder lesions, right shoulder: Secondary | ICD-10-CM | POA: Diagnosis not present

## 2021-08-17 DIAGNOSIS — M4722 Other spondylosis with radiculopathy, cervical region: Secondary | ICD-10-CM | POA: Diagnosis not present

## 2021-08-17 DIAGNOSIS — I10 Essential (primary) hypertension: Secondary | ICD-10-CM | POA: Diagnosis not present

## 2021-08-17 DIAGNOSIS — K219 Gastro-esophageal reflux disease without esophagitis: Secondary | ICD-10-CM | POA: Diagnosis not present

## 2021-08-23 DIAGNOSIS — R5382 Chronic fatigue, unspecified: Secondary | ICD-10-CM | POA: Diagnosis not present

## 2021-08-23 DIAGNOSIS — E875 Hyperkalemia: Secondary | ICD-10-CM | POA: Diagnosis not present

## 2021-08-30 DIAGNOSIS — E875 Hyperkalemia: Secondary | ICD-10-CM | POA: Diagnosis not present

## 2021-08-30 DIAGNOSIS — R5382 Chronic fatigue, unspecified: Secondary | ICD-10-CM | POA: Diagnosis not present

## 2021-09-29 DIAGNOSIS — H5203 Hypermetropia, bilateral: Secondary | ICD-10-CM | POA: Diagnosis not present

## 2021-10-15 DIAGNOSIS — R030 Elevated blood-pressure reading, without diagnosis of hypertension: Secondary | ICD-10-CM | POA: Diagnosis not present

## 2021-10-15 DIAGNOSIS — R059 Cough, unspecified: Secondary | ICD-10-CM | POA: Diagnosis not present

## 2021-10-15 DIAGNOSIS — J020 Streptococcal pharyngitis: Secondary | ICD-10-CM | POA: Diagnosis not present

## 2022-01-20 DIAGNOSIS — L821 Other seborrheic keratosis: Secondary | ICD-10-CM | POA: Diagnosis not present

## 2022-01-20 DIAGNOSIS — H61001 Unspecified perichondritis of right external ear: Secondary | ICD-10-CM | POA: Diagnosis not present

## 2022-01-20 DIAGNOSIS — Z85828 Personal history of other malignant neoplasm of skin: Secondary | ICD-10-CM | POA: Diagnosis not present

## 2022-01-20 DIAGNOSIS — L570 Actinic keratosis: Secondary | ICD-10-CM | POA: Diagnosis not present

## 2022-01-28 DIAGNOSIS — M4722 Other spondylosis with radiculopathy, cervical region: Secondary | ICD-10-CM | POA: Diagnosis not present

## 2022-01-28 DIAGNOSIS — M7581 Other shoulder lesions, right shoulder: Secondary | ICD-10-CM | POA: Diagnosis not present

## 2022-02-09 DIAGNOSIS — D692 Other nonthrombocytopenic purpura: Secondary | ICD-10-CM | POA: Diagnosis not present

## 2022-02-09 DIAGNOSIS — L821 Other seborrheic keratosis: Secondary | ICD-10-CM | POA: Diagnosis not present

## 2022-02-09 DIAGNOSIS — Z85828 Personal history of other malignant neoplasm of skin: Secondary | ICD-10-CM | POA: Diagnosis not present

## 2022-02-09 DIAGNOSIS — D1801 Hemangioma of skin and subcutaneous tissue: Secondary | ICD-10-CM | POA: Diagnosis not present

## 2022-02-09 DIAGNOSIS — D225 Melanocytic nevi of trunk: Secondary | ICD-10-CM | POA: Diagnosis not present

## 2022-03-30 DIAGNOSIS — Z1231 Encounter for screening mammogram for malignant neoplasm of breast: Secondary | ICD-10-CM | POA: Diagnosis not present

## 2022-03-30 DIAGNOSIS — Z1329 Encounter for screening for other suspected endocrine disorder: Secondary | ICD-10-CM | POA: Diagnosis not present

## 2022-03-30 DIAGNOSIS — K2100 Gastro-esophageal reflux disease with esophagitis, without bleeding: Secondary | ICD-10-CM | POA: Diagnosis not present

## 2022-03-30 DIAGNOSIS — I10 Essential (primary) hypertension: Secondary | ICD-10-CM | POA: Diagnosis not present

## 2022-03-30 DIAGNOSIS — E7849 Other hyperlipidemia: Secondary | ICD-10-CM | POA: Diagnosis not present

## 2022-04-04 DIAGNOSIS — M47812 Spondylosis without myelopathy or radiculopathy, cervical region: Secondary | ICD-10-CM | POA: Diagnosis not present

## 2022-04-04 DIAGNOSIS — M9901 Segmental and somatic dysfunction of cervical region: Secondary | ICD-10-CM | POA: Diagnosis not present

## 2022-04-04 DIAGNOSIS — M531 Cervicobrachial syndrome: Secondary | ICD-10-CM | POA: Diagnosis not present

## 2022-04-05 DIAGNOSIS — G4700 Insomnia, unspecified: Secondary | ICD-10-CM | POA: Diagnosis not present

## 2022-04-05 DIAGNOSIS — K2100 Gastro-esophageal reflux disease with esophagitis, without bleeding: Secondary | ICD-10-CM | POA: Diagnosis not present

## 2022-04-05 DIAGNOSIS — Z0001 Encounter for general adult medical examination with abnormal findings: Secondary | ICD-10-CM | POA: Diagnosis not present

## 2022-04-05 DIAGNOSIS — J301 Allergic rhinitis due to pollen: Secondary | ICD-10-CM | POA: Diagnosis not present

## 2022-04-05 DIAGNOSIS — R030 Elevated blood-pressure reading, without diagnosis of hypertension: Secondary | ICD-10-CM | POA: Diagnosis not present

## 2022-04-05 DIAGNOSIS — E7849 Other hyperlipidemia: Secondary | ICD-10-CM | POA: Diagnosis not present

## 2022-04-05 DIAGNOSIS — R5382 Chronic fatigue, unspecified: Secondary | ICD-10-CM | POA: Diagnosis not present

## 2022-04-11 DIAGNOSIS — M531 Cervicobrachial syndrome: Secondary | ICD-10-CM | POA: Diagnosis not present

## 2022-04-11 DIAGNOSIS — M47812 Spondylosis without myelopathy or radiculopathy, cervical region: Secondary | ICD-10-CM | POA: Diagnosis not present

## 2022-04-11 DIAGNOSIS — M9901 Segmental and somatic dysfunction of cervical region: Secondary | ICD-10-CM | POA: Diagnosis not present

## 2022-04-25 DIAGNOSIS — H109 Unspecified conjunctivitis: Secondary | ICD-10-CM | POA: Diagnosis not present

## 2022-05-04 DIAGNOSIS — J029 Acute pharyngitis, unspecified: Secondary | ICD-10-CM | POA: Diagnosis not present

## 2022-07-14 DIAGNOSIS — Z1212 Encounter for screening for malignant neoplasm of rectum: Secondary | ICD-10-CM | POA: Diagnosis not present

## 2022-07-14 DIAGNOSIS — Z1211 Encounter for screening for malignant neoplasm of colon: Secondary | ICD-10-CM | POA: Diagnosis not present

## 2022-09-05 DIAGNOSIS — E875 Hyperkalemia: Secondary | ICD-10-CM | POA: Diagnosis not present

## 2022-09-05 DIAGNOSIS — E7849 Other hyperlipidemia: Secondary | ICD-10-CM | POA: Diagnosis not present

## 2022-09-05 DIAGNOSIS — R946 Abnormal results of thyroid function studies: Secondary | ICD-10-CM | POA: Diagnosis not present

## 2022-09-05 DIAGNOSIS — R5383 Other fatigue: Secondary | ICD-10-CM | POA: Diagnosis not present

## 2022-09-07 DIAGNOSIS — G4700 Insomnia, unspecified: Secondary | ICD-10-CM | POA: Diagnosis not present

## 2022-09-07 DIAGNOSIS — K2100 Gastro-esophageal reflux disease with esophagitis, without bleeding: Secondary | ICD-10-CM | POA: Diagnosis not present

## 2022-09-07 DIAGNOSIS — R030 Elevated blood-pressure reading, without diagnosis of hypertension: Secondary | ICD-10-CM | POA: Diagnosis not present

## 2022-09-07 DIAGNOSIS — R5382 Chronic fatigue, unspecified: Secondary | ICD-10-CM | POA: Diagnosis not present

## 2022-09-07 DIAGNOSIS — E7849 Other hyperlipidemia: Secondary | ICD-10-CM | POA: Diagnosis not present

## 2022-09-07 DIAGNOSIS — J301 Allergic rhinitis due to pollen: Secondary | ICD-10-CM | POA: Diagnosis not present

## 2022-09-19 DIAGNOSIS — D225 Melanocytic nevi of trunk: Secondary | ICD-10-CM | POA: Diagnosis not present

## 2022-09-19 DIAGNOSIS — H61001 Unspecified perichondritis of right external ear: Secondary | ICD-10-CM | POA: Diagnosis not present

## 2022-09-19 DIAGNOSIS — L821 Other seborrheic keratosis: Secondary | ICD-10-CM | POA: Diagnosis not present

## 2022-09-19 DIAGNOSIS — Z85828 Personal history of other malignant neoplasm of skin: Secondary | ICD-10-CM | POA: Diagnosis not present

## 2022-09-19 DIAGNOSIS — D1801 Hemangioma of skin and subcutaneous tissue: Secondary | ICD-10-CM | POA: Diagnosis not present

## 2022-09-19 DIAGNOSIS — D2271 Melanocytic nevi of right lower limb, including hip: Secondary | ICD-10-CM | POA: Diagnosis not present

## 2022-09-21 DIAGNOSIS — M531 Cervicobrachial syndrome: Secondary | ICD-10-CM | POA: Diagnosis not present

## 2022-09-21 DIAGNOSIS — M47812 Spondylosis without myelopathy or radiculopathy, cervical region: Secondary | ICD-10-CM | POA: Diagnosis not present

## 2022-09-21 DIAGNOSIS — M9901 Segmental and somatic dysfunction of cervical region: Secondary | ICD-10-CM | POA: Diagnosis not present

## 2022-09-23 ENCOUNTER — Other Ambulatory Visit: Payer: Self-pay | Admitting: Orthopaedic Surgery

## 2022-09-23 DIAGNOSIS — M4722 Other spondylosis with radiculopathy, cervical region: Secondary | ICD-10-CM | POA: Diagnosis not present

## 2022-09-23 DIAGNOSIS — Z01812 Encounter for preprocedural laboratory examination: Secondary | ICD-10-CM | POA: Diagnosis not present

## 2022-09-23 DIAGNOSIS — M25511 Pain in right shoulder: Secondary | ICD-10-CM | POA: Diagnosis not present

## 2022-09-23 DIAGNOSIS — M7581 Other shoulder lesions, right shoulder: Secondary | ICD-10-CM | POA: Diagnosis not present

## 2022-09-23 DIAGNOSIS — Z01818 Encounter for other preprocedural examination: Secondary | ICD-10-CM

## 2022-09-23 DIAGNOSIS — M25532 Pain in left wrist: Secondary | ICD-10-CM | POA: Diagnosis not present

## 2022-10-11 DIAGNOSIS — M5416 Radiculopathy, lumbar region: Secondary | ICD-10-CM | POA: Diagnosis not present

## 2022-10-17 ENCOUNTER — Ambulatory Visit
Admission: RE | Admit: 2022-10-17 | Discharge: 2022-10-17 | Disposition: A | Discharge status: Home / Self Care | Payer: 59 | Source: Ambulatory Visit | Attending: Orthopaedic Surgery | Admitting: Orthopaedic Surgery

## 2022-10-17 DIAGNOSIS — M19011 Primary osteoarthritis, right shoulder: Secondary | ICD-10-CM | POA: Diagnosis not present

## 2022-10-17 DIAGNOSIS — M25511 Pain in right shoulder: Secondary | ICD-10-CM | POA: Diagnosis not present

## 2022-10-17 DIAGNOSIS — M19019 Primary osteoarthritis, unspecified shoulder: Secondary | ICD-10-CM | POA: Diagnosis not present

## 2022-10-17 DIAGNOSIS — Z01818 Encounter for other preprocedural examination: Secondary | ICD-10-CM

## 2022-10-19 DIAGNOSIS — M531 Cervicobrachial syndrome: Secondary | ICD-10-CM | POA: Diagnosis not present

## 2022-10-19 DIAGNOSIS — M9901 Segmental and somatic dysfunction of cervical region: Secondary | ICD-10-CM | POA: Diagnosis not present

## 2022-10-19 DIAGNOSIS — M47812 Spondylosis without myelopathy or radiculopathy, cervical region: Secondary | ICD-10-CM | POA: Diagnosis not present

## 2022-10-21 ENCOUNTER — Other Ambulatory Visit: Payer: Medicare Other

## 2022-11-09 DIAGNOSIS — G8918 Other acute postprocedural pain: Secondary | ICD-10-CM | POA: Diagnosis not present

## 2022-11-09 DIAGNOSIS — M19011 Primary osteoarthritis, right shoulder: Secondary | ICD-10-CM | POA: Diagnosis not present

## 2022-11-09 DIAGNOSIS — M75121 Complete rotator cuff tear or rupture of right shoulder, not specified as traumatic: Secondary | ICD-10-CM | POA: Diagnosis not present

## 2022-11-10 DIAGNOSIS — W19XXXA Unspecified fall, initial encounter: Secondary | ICD-10-CM | POA: Diagnosis not present

## 2022-11-10 DIAGNOSIS — R000 Tachycardia, unspecified: Secondary | ICD-10-CM | POA: Diagnosis not present

## 2022-11-10 DIAGNOSIS — R0902 Hypoxemia: Secondary | ICD-10-CM | POA: Diagnosis not present

## 2022-11-11 DIAGNOSIS — M25511 Pain in right shoulder: Secondary | ICD-10-CM | POA: Diagnosis not present

## 2022-11-17 DIAGNOSIS — M25511 Pain in right shoulder: Secondary | ICD-10-CM | POA: Diagnosis not present

## 2022-11-23 DIAGNOSIS — Z96611 Presence of right artificial shoulder joint: Secondary | ICD-10-CM | POA: Diagnosis not present

## 2022-11-24 DIAGNOSIS — M25511 Pain in right shoulder: Secondary | ICD-10-CM | POA: Diagnosis not present

## 2022-11-28 DIAGNOSIS — Z96611 Presence of right artificial shoulder joint: Secondary | ICD-10-CM | POA: Diagnosis not present

## 2022-12-02 DIAGNOSIS — Z96611 Presence of right artificial shoulder joint: Secondary | ICD-10-CM | POA: Diagnosis not present

## 2022-12-05 DIAGNOSIS — R059 Cough, unspecified: Secondary | ICD-10-CM | POA: Diagnosis not present

## 2022-12-05 DIAGNOSIS — Z96611 Presence of right artificial shoulder joint: Secondary | ICD-10-CM | POA: Diagnosis not present

## 2022-12-05 DIAGNOSIS — M545 Low back pain, unspecified: Secondary | ICD-10-CM | POA: Diagnosis not present

## 2022-12-07 DIAGNOSIS — Z96611 Presence of right artificial shoulder joint: Secondary | ICD-10-CM | POA: Diagnosis not present

## 2022-12-12 DIAGNOSIS — Z96611 Presence of right artificial shoulder joint: Secondary | ICD-10-CM | POA: Diagnosis not present

## 2022-12-14 DIAGNOSIS — W19XXXA Unspecified fall, initial encounter: Secondary | ICD-10-CM | POA: Diagnosis not present

## 2022-12-14 DIAGNOSIS — T1490XA Injury, unspecified, initial encounter: Secondary | ICD-10-CM | POA: Diagnosis not present

## 2022-12-14 DIAGNOSIS — R5381 Other malaise: Secondary | ICD-10-CM | POA: Diagnosis not present

## 2022-12-20 DIAGNOSIS — M25511 Pain in right shoulder: Secondary | ICD-10-CM | POA: Diagnosis not present

## 2022-12-21 DIAGNOSIS — Z96611 Presence of right artificial shoulder joint: Secondary | ICD-10-CM | POA: Diagnosis not present

## 2022-12-22 DIAGNOSIS — Z96611 Presence of right artificial shoulder joint: Secondary | ICD-10-CM | POA: Diagnosis not present

## 2022-12-26 DIAGNOSIS — Z96611 Presence of right artificial shoulder joint: Secondary | ICD-10-CM | POA: Diagnosis not present

## 2022-12-28 DIAGNOSIS — R531 Weakness: Secondary | ICD-10-CM | POA: Diagnosis not present

## 2022-12-28 DIAGNOSIS — Z96611 Presence of right artificial shoulder joint: Secondary | ICD-10-CM | POA: Diagnosis not present

## 2023-01-02 DIAGNOSIS — Z96611 Presence of right artificial shoulder joint: Secondary | ICD-10-CM | POA: Diagnosis not present

## 2023-01-10 DIAGNOSIS — M19011 Primary osteoarthritis, right shoulder: Secondary | ICD-10-CM | POA: Diagnosis not present

## 2023-01-11 DIAGNOSIS — Z96611 Presence of right artificial shoulder joint: Secondary | ICD-10-CM | POA: Diagnosis not present

## 2023-01-13 DIAGNOSIS — H524 Presbyopia: Secondary | ICD-10-CM | POA: Diagnosis not present

## 2023-01-14 DIAGNOSIS — M542 Cervicalgia: Secondary | ICD-10-CM | POA: Diagnosis not present

## 2023-01-24 DIAGNOSIS — H2513 Age-related nuclear cataract, bilateral: Secondary | ICD-10-CM | POA: Diagnosis not present

## 2023-01-24 DIAGNOSIS — H04123 Dry eye syndrome of bilateral lacrimal glands: Secondary | ICD-10-CM | POA: Diagnosis not present

## 2023-01-31 DIAGNOSIS — M47812 Spondylosis without myelopathy or radiculopathy, cervical region: Secondary | ICD-10-CM | POA: Diagnosis not present

## 2023-02-08 NOTE — H&P (Signed)
Surgical History & Physical  Patient Name: Dominique Brooks  DOB: 04/02/1951  Surgery: Cataract extraction with intraocular lens implant phacoemulsification; Right Eye Surgeon: Pecolia Ades MD Surgery Date: 02/17/2023 Pre-Op Date: 01/24/2023  HPI: A 39 Yr. old female patient present for cataract eval per Dr. Earlene Plater. 1. The patient complains of sunlight glare causing poor vision, reading fine print like on medicine bottles, and poor night vision, which began 1 year ago. Both eyes are affected, OD>OS. The symptoms have been gradual. The condition's severity is worsening. This is negatively affecting the patient's quality of life and the patient is unable to function adequately in life with the current level of vision.  Medical History: Dry Eyes Cataracts  Anxiety  Review of Systems Psychiatry Anxiety All recorded systems are negative except as noted above.  Social Never smoked   Medication alprazolam   Sx/Procedures Ankle Surgery - Lt, Rotator cuff repair  Drug Allergies  Sulfa (Sulfonamide Antibiotics)   History & Physical: Heent: cataracts NECK: supple without bruits LUNGS: lungs clear to auscultation CV: regular rate and rhythm Abdomen: soft and non-tender  Impression & Plan: Assessment: 1.  CATARACT AGE-RELATED NUCLEAR; Both Eyes (H25.13) 2.  Hyperopia ; Both Eyes (H52.03) 3.  DRY EYE SYNDROME/TEAR FILM INSUFFICIENCY; Both Eyes (H04.123)  Plan: 1.  Cataracts are visually significant and account for the patient's complaints. Discussed all risks, benefits, procedures and recovery, including infection, loss of vision and eye, need for glasses after surgery or additional procedures. Patient understands changing glasses will not improve vision. Patient indicated understanding of procedure. All questions answered. Patient desires to have surgery, recommend phacoemulsification with intraocular lens. Patient to have preliminary testing necessary ([employer]/IOL Master, Mac OCT,  TOPO) Educational materials provided:Cataract.  Plan: - Proceed with cataract surgery OD followed by OS - Best distance target OU, DIB00 lens - no fuchs, no DM, ok with lying flat during surgery  2.  per Dr. Earlene Plater  3.  Findings, prognosis and treatment options reviewed. Begin basic treatment regimen; warm compresses, lid scrubs, and preserved artificial tears QID.

## 2023-02-09 DIAGNOSIS — M19011 Primary osteoarthritis, right shoulder: Secondary | ICD-10-CM | POA: Diagnosis not present

## 2023-02-10 ENCOUNTER — Encounter (HOSPITAL_COMMUNITY)
Admission: RE | Admit: 2023-02-10 | Discharge: 2023-02-10 | Disposition: A | Discharge status: Home / Self Care | Payer: 59 | Source: Ambulatory Visit | Attending: Optometry | Admitting: Optometry

## 2023-02-10 DIAGNOSIS — H2511 Age-related nuclear cataract, right eye: Secondary | ICD-10-CM | POA: Diagnosis not present

## 2023-02-10 NOTE — Pre-Procedure Instructions (Signed)
Attempted pre-op phonecall. Left VM for her to call us back. 

## 2023-02-13 ENCOUNTER — Encounter (HOSPITAL_COMMUNITY): Payer: Self-pay

## 2023-02-13 ENCOUNTER — Other Ambulatory Visit: Payer: Self-pay

## 2023-02-16 DIAGNOSIS — E7849 Other hyperlipidemia: Secondary | ICD-10-CM | POA: Diagnosis not present

## 2023-02-16 DIAGNOSIS — E039 Hypothyroidism, unspecified: Secondary | ICD-10-CM | POA: Diagnosis not present

## 2023-02-16 DIAGNOSIS — I10 Essential (primary) hypertension: Secondary | ICD-10-CM | POA: Diagnosis not present

## 2023-02-17 ENCOUNTER — Ambulatory Visit (HOSPITAL_COMMUNITY): Payer: 59 | Admitting: Certified Registered Nurse Anesthetist

## 2023-02-17 ENCOUNTER — Encounter (HOSPITAL_COMMUNITY): Payer: Self-pay | Admitting: Optometry

## 2023-02-17 ENCOUNTER — Encounter (HOSPITAL_COMMUNITY)
Admission: RE | Disposition: A | Discharge status: Home / Self Care | Payer: Self-pay | Source: Home / Self Care | Attending: Optometry

## 2023-02-17 ENCOUNTER — Ambulatory Visit (HOSPITAL_COMMUNITY)
Admission: RE | Admit: 2023-02-17 | Discharge: 2023-02-17 | Disposition: A | Discharge status: Home / Self Care | Payer: 59 | Attending: Optometry | Admitting: Optometry

## 2023-02-17 DIAGNOSIS — H5203 Hypermetropia, bilateral: Secondary | ICD-10-CM | POA: Diagnosis not present

## 2023-02-17 DIAGNOSIS — H2511 Age-related nuclear cataract, right eye: Secondary | ICD-10-CM | POA: Insufficient documentation

## 2023-02-17 DIAGNOSIS — H04123 Dry eye syndrome of bilateral lacrimal glands: Secondary | ICD-10-CM | POA: Insufficient documentation

## 2023-02-17 HISTORY — PX: CATARACT EXTRACTION W/PHACO: SHX586

## 2023-02-17 SURGERY — PHACOEMULSIFICATION, CATARACT, WITH IOL INSERTION
Anesthesia: Monitor Anesthesia Care | Site: Eye | Laterality: Right

## 2023-02-17 MED ORDER — FENTANYL CITRATE (PF) 100 MCG/2ML IJ SOLN
INTRAMUSCULAR | Status: AC
  Filled 2023-02-17: qty 2

## 2023-02-17 MED ORDER — MOXIFLOXACIN HCL 5 MG/ML IO SOLN
INTRAOCULAR | Status: DC | PRN
  Administered 2023-02-17: .2 mL via INTRACAMERAL

## 2023-02-17 MED ORDER — MIDAZOLAM HCL 2 MG/2ML IJ SOLN
INTRAMUSCULAR | Status: AC
  Filled 2023-02-17: qty 2

## 2023-02-17 MED ORDER — TETRACAINE HCL 0.5 % OP SOLN
1.0000 [drp] | OPHTHALMIC | Status: AC
  Administered 2023-02-17 (×3): 1 [drp] via OPHTHALMIC

## 2023-02-17 MED ORDER — SIGHTPATH DOSE#1 NA HYALUR & NA CHOND-NA HYALUR IO KIT
PACK | INTRAOCULAR | Status: DC | PRN
  Administered 2023-02-17: 1 via OPHTHALMIC

## 2023-02-17 MED ORDER — PHENYLEPHRINE HCL 2.5 % OP SOLN
1.0000 [drp] | OPHTHALMIC | Status: AC
  Administered 2023-02-17 (×3): 1 [drp] via OPHTHALMIC

## 2023-02-17 MED ORDER — BSS IO SOLN
INTRAOCULAR | Status: DC | PRN
  Administered 2023-02-17: 15 mL via INTRAOCULAR

## 2023-02-17 MED ORDER — TROPICAMIDE 1 % OP SOLN
1.0000 [drp] | OPHTHALMIC | Status: AC
  Administered 2023-02-17 (×3): 1 [drp] via OPHTHALMIC

## 2023-02-17 MED ORDER — PHENYLEPHRINE-KETOROLAC 1-0.3 % IO SOLN
INTRAOCULAR | Status: AC
  Filled 2023-02-17: qty 4

## 2023-02-17 MED ORDER — LIDOCAINE HCL 3.5 % OP GEL
1.0000 | Freq: Once | OPHTHALMIC | Status: AC
  Administered 2023-02-17: 1 via OPHTHALMIC

## 2023-02-17 MED ORDER — LIDOCAINE HCL (PF) 1 % IJ SOLN
INTRAMUSCULAR | Status: DC | PRN
  Administered 2023-02-17: 2 mL

## 2023-02-17 MED ORDER — PHENYLEPHRINE-KETOROLAC 1-0.3 % IO SOLN
INTRAOCULAR | Status: DC | PRN
  Administered 2023-02-17: 500 mL via OPHTHALMIC

## 2023-02-17 MED ORDER — POVIDONE-IODINE 5 % OP SOLN
OPHTHALMIC | Status: DC | PRN
  Administered 2023-02-17: 1 via OPHTHALMIC

## 2023-02-17 MED ORDER — MOXIFLOXACIN HCL 5 MG/ML IO SOLN
INTRAOCULAR | Status: AC
  Filled 2023-02-17: qty 1

## 2023-02-17 MED ORDER — STERILE WATER FOR IRRIGATION IR SOLN
Status: DC | PRN
  Administered 2023-02-17: 250 mL

## 2023-02-17 SURGICAL SUPPLY — 15 items
CATARACT SUITE SIGHTPATH (MISCELLANEOUS) ×1
CLOTH BEACON ORANGE TIMEOUT ST (SAFETY) ×1 IMPLANT
DRSG TEGADERM 4X4.75 (GAUZE/BANDAGES/DRESSINGS) ×1 IMPLANT
EYE SHIELD UNIVERSAL CLEAR (GAUZE/BANDAGES/DRESSINGS) IMPLANT
FEE CATARACT SUITE SIGHTPATH (MISCELLANEOUS) ×1 IMPLANT
GLOVE BIOGEL PI IND STRL 7.0 (GLOVE) ×2 IMPLANT
LENS IOL TECNIS EYHANCE 23.0 (Intraocular Lens) IMPLANT
NDL HYPO 18GX1.5 BLUNT FILL (NEEDLE) ×1 IMPLANT
NEEDLE HYPO 18GX1.5 BLUNT FILL (NEEDLE) ×1
PAD ARMBOARD 7.5X6 YLW CONV (MISCELLANEOUS) ×1 IMPLANT
POSITIONER HEAD 8X9X4 ADT (SOFTGOODS) ×1 IMPLANT
RING MALYGIN 7.0 (MISCELLANEOUS) IMPLANT
SYR TB 1ML LL NO SAFETY (SYRINGE) ×1 IMPLANT
TAPE SURG TRANSPORE 1 IN (GAUZE/BANDAGES/DRESSINGS) IMPLANT
WATER STERILE IRR 250ML POUR (IV SOLUTION) ×1 IMPLANT

## 2023-02-17 NOTE — Transfer of Care (Signed)
Immediate Anesthesia Transfer of Care Note  Patient: Dominique Brooks  Procedure(s) Performed: CATARACT EXTRACTION PHACO AND INTRAOCULAR LENS PLACEMENT (IOC) (Right: Eye)  Patient Location: Short Stay  Anesthesia Type:MAC  Level of Consciousness: awake, alert , oriented, and patient cooperative  Airway & Oxygen Therapy: Patient Spontanous Breathing  Post-op Assessment: Report given to RN, Post -op Vital signs reviewed and stable, and Patient moving all extremities X 4  Post vital signs: Reviewed and stable  Last Vitals:  Vitals Value Taken Time  BP 93/77   Temp 98.6   Pulse 89   Resp 16   SpO2 97     Last Pain:  Vitals:   02/17/23 0646  PainSc: 0-No pain         Complications: No notable events documented.

## 2023-02-17 NOTE — Anesthesia Postprocedure Evaluation (Signed)
Anesthesia Post Note  Patient: Dominique Brooks  Procedure(s) Performed: CATARACT EXTRACTION PHACO AND INTRAOCULAR LENS PLACEMENT (IOC) (Right: Eye)  Patient location during evaluation: Phase II Anesthesia Type: MAC Level of consciousness: awake Pain management: pain level controlled Vital Signs Assessment: post-procedure vital signs reviewed and stable Respiratory status: spontaneous breathing and respiratory function stable Cardiovascular status: blood pressure returned to baseline and stable Postop Assessment: no headache and no apparent nausea or vomiting Anesthetic complications: no Comments: Late entry   No notable events documented.   Last Vitals:  Vitals:   02/17/23 0715 02/17/23 0826  BP:  93/77  Pulse: 86 86  Resp: 18 19  Temp:  37 C  SpO2: 96% 97%    Last Pain:  Vitals:   02/17/23 0826  TempSrc: Oral  PainSc: 0-No pain                 Windell Norfolk

## 2023-02-17 NOTE — Op Note (Signed)
Date of procedure: 02/17/23  Pre-operative diagnosis: Visually significant age-related nuclear cataract, Right Eye (H25.11)  Post-operative diagnosis: Visually significant age-related nuclear cataract, Right Eye  Procedure: Removal of cataract via phacoemulsification and insertion of intra-ocular lens J&J DIBOO +23.0D into the capsular bag of the Right Eye  Attending surgeon: Pecolia Ades, MD  Anesthesia: MAC, Topical Akten  Complications: None  Estimated Blood Loss: <81mL (minimal)  Specimens: None  Implants:  Implant Name Type Inv. Item Serial No. Manufacturer Lot No. LRB No. Used Action  LENS IOL TECNIS EYHANCE 23.0 - [id number] Intraocular Lens LENS IOL TECNIS EYHANCE 23.0 [id number] [employer]IGHT[employer]  Right 1 Implanted    Indications:  Visually significant age-related cataract, Right Eye  Procedure:  The patient was seen and identified in the pre-operative area. The operative eye was identified and dilated.  The operative eye was marked.  Topical anesthesia was administered to the operative eye.     The patient was then to the operative suite and placed in the supine position.  A timeout was performed confirming the patient, procedure to be performed, and all other relevant information.   The patient's face was prepped and draped in the usual fashion for intra-ocular surgery.  A lid speculum was placed into the operative eye and the surgical microscope moved into place and focused.  A superotemporal paracentesis was created using a 20 gauge paracentesis blade.  BSS mixed with Omidria, followed by 1% lidocaine was injected into the anterior chamber.  Viscoelastic was injected into the anterior chamber.  A temporal clear-corneal main wound incision was created using a 2.54mm microkeratome.  A continuous curvilinear capsulorrhexis was initiated using an irrigating cystitome and completed using capsulorrhexis forceps.  Hydrodissection and hydrodeliniation were performed.  Viscoelastic was  injected into the anterior chamber.  A phacoemulsification handpiece and a chopper as a second instrument were used to remove the nucleus and epinucleus. The irrigation/aspiration handpiece was used to remove any remaining cortical material.   The capsular bag was reinflated with viscoelastic, checked, and found to be intact.  The intraocular lens was inserted into the capsular bag.  The irrigation/aspiration handpiece was used to remove any remaining viscoelastic.  The clear corneal wound and paracentesis wounds were then hydrated and checked with Weck-Cels to be watertight. Moxifloxacin was instilled into the anterior chamber.  The lid-speculum and drape was removed, and the patient's face was cleaned with a wet and dry 4x4. A clear shield was taped over the eye. The patient was taken to the post-operative care unit in good condition, having tolerated the procedure well.  Post-Op Instructions: The patient will follow up at [facility] for a same day post-operative evaluation and will receive all other orders and instructions.

## 2023-02-17 NOTE — Anesthesia Preprocedure Evaluation (Signed)
Anesthesia Evaluation  Patient identified by MRN, date of birth, ID band Patient awake    Reviewed: Allergy & Precautions, H&P , NPO status , Patient's Chart, lab work & pertinent test results, reviewed documented beta blocker date and time   Airway Mallampati: II  TM Distance: >3 FB Neck ROM: full    Dental no notable dental hx.    Pulmonary neg pulmonary ROS   Pulmonary exam normal breath sounds clear to auscultation       Cardiovascular Exercise Tolerance: Good negative cardio ROS + Valvular Problems/Murmurs  Rhythm:regular Rate:Normal     Neuro/Psych negative neurological ROS  negative psych ROS   GI/Hepatic negative GI ROS, Neg liver ROS,GERD  ,,  Endo/Other  negative endocrine ROS    Renal/GU negative Renal ROS  negative genitourinary   Musculoskeletal   Abdominal   Peds  Hematology negative hematology ROS (+)   Anesthesia Other Findings   Reproductive/Obstetrics negative OB ROS                             Anesthesia Physical Anesthesia Plan  ASA: 2  Anesthesia Plan: MAC   Post-op Pain Management:    Induction:   PONV Risk Score and Plan:   Airway Management Planned:   Additional Equipment:   Intra-op Plan:   Post-operative Plan:   Informed Consent: I have reviewed the patients History and Physical, chart, labs and discussed the procedure including the risks, benefits and alternatives for the proposed anesthesia with the patient or authorized representative who has indicated his/her understanding and acceptance.     Dental Advisory Given  Plan Discussed with: CRNA  Anesthesia Plan Comments:        Anesthesia Quick Evaluation

## 2023-02-17 NOTE — Discharge Instructions (Signed)
Please discharge patient when stable, will follow up today with Dr. Tod Abrahamsen at the [facility] office immediately following discharge.  Leave shield in place until visit.  All paperwork with discharge instructions will be given at the office.  [facility] Address:  [address]  [place], [place] [address]  Dr. Feliciano Wynter's Phone: [phone]  

## 2023-02-17 NOTE — Interval H&P Note (Signed)
History and Physical Interval Note:  02/17/2023 7:14 AM  The H and P was reviewed and updated. The patient was examined.  No changes were found after exam.  The surgical eye was marked.  Dominique Brooks

## 2023-02-20 DIAGNOSIS — R030 Elevated blood-pressure reading, without diagnosis of hypertension: Secondary | ICD-10-CM | POA: Diagnosis not present

## 2023-02-20 DIAGNOSIS — K2100 Gastro-esophageal reflux disease with esophagitis, without bleeding: Secondary | ICD-10-CM | POA: Diagnosis not present

## 2023-02-20 DIAGNOSIS — G4700 Insomnia, unspecified: Secondary | ICD-10-CM | POA: Diagnosis not present

## 2023-02-20 DIAGNOSIS — E7849 Other hyperlipidemia: Secondary | ICD-10-CM | POA: Diagnosis not present

## 2023-02-20 DIAGNOSIS — Z23 Encounter for immunization: Secondary | ICD-10-CM | POA: Diagnosis not present

## 2023-02-20 DIAGNOSIS — R5382 Chronic fatigue, unspecified: Secondary | ICD-10-CM | POA: Diagnosis not present

## 2023-02-20 DIAGNOSIS — J301 Allergic rhinitis due to pollen: Secondary | ICD-10-CM | POA: Diagnosis not present

## 2023-02-21 ENCOUNTER — Encounter (HOSPITAL_COMMUNITY): Payer: Self-pay | Admitting: Optometry

## 2023-02-24 DIAGNOSIS — H2512 Age-related nuclear cataract, left eye: Secondary | ICD-10-CM | POA: Diagnosis not present

## 2023-02-27 NOTE — H&P (Signed)
Surgical History & Physical  Patient Name: Dominique Brooks  DOB: Nov 27, 1950  Surgery: Cataract extraction with intraocular lens implant phacoemulsification; Left Eye Surgeon: Pecolia Ades MD Surgery Date: 03/03/2023 Pre-Op Date: 02/21/2023  HPI: A 29 Yr. old female patient 1. The patient is returning for a cataract follow-up of the right eye. Since the last visit, the affected area is doing well. The patient's vision is improved. Patient is following medication instructions. Difficulties reading fine print, captions on TV, driving due to glare at night and during the day due to cataract OS. This is negatively affecting the patient's quality of life and the patient is unable to function adequately in life with the current level of vision. Pt. is ready for cataract surgery on OS, due to blurred vision. HPI was performed by Pecolia Ades .  Medical History: Dry Eyes Cataracts  Anxiety  Review of Systems Psychiatry Anxiety All recorded systems are negative except as noted above.  Social Never smoked   Medication Ciprofloxacin hcl, Prednisolone acetate,  alprazolam   Sx/Procedures Phaco c IOL OD,  Ankle Surgery - Lt, Rotator cuff repair   Drug Allergies  Sulfa (Sulfonamide Antibiotics)   History & Physical: Heent: cataract OS NECK: supple without bruits LUNGS: lungs clear to auscultation CV: regular rate and rhythm Abdomen: soft and non-tender  Impression & Plan: Assessment: 1.  CATARACT EXTRACTION STATUS; Right Eye (Z98.41) 2.  INTRAOCULAR LENS IOL ; Right Eye (Z96.1)  Plan: 1.  POD5. Doing well. All post-op precautions discussed and instructions reviewed. Written instructions given.  2.  monitor

## 2023-03-01 ENCOUNTER — Encounter (HOSPITAL_COMMUNITY)
Admission: RE | Admit: 2023-03-01 | Discharge: 2023-03-01 | Disposition: A | Discharge status: Home / Self Care | Payer: 59 | Source: Ambulatory Visit | Attending: Optometry | Admitting: Optometry

## 2023-03-01 NOTE — Progress Notes (Signed)
Second attempt to contact patient for her pre-op phone call; no answer

## 2023-03-01 NOTE — Progress Notes (Signed)
Attempted pre-op phone call; no answer at this time.  Message left.

## 2023-03-02 ENCOUNTER — Encounter (HOSPITAL_COMMUNITY): Payer: Self-pay

## 2023-03-02 NOTE — Progress Notes (Signed)
Attempted to call patient for pre-op phone call; no answer.

## 2023-03-02 NOTE — Progress Notes (Signed)
Attempted to call patient for her pre-op phone call; no answer; left message

## 2023-03-03 ENCOUNTER — Encounter (HOSPITAL_COMMUNITY)
Admission: RE | Disposition: A | Discharge status: Home / Self Care | Payer: Self-pay | Source: Home / Self Care | Attending: Optometry

## 2023-03-03 ENCOUNTER — Encounter (HOSPITAL_COMMUNITY): Payer: Self-pay | Admitting: Optometry

## 2023-03-03 ENCOUNTER — Ambulatory Visit (HOSPITAL_COMMUNITY): Payer: 59 | Admitting: Anesthesiology

## 2023-03-03 ENCOUNTER — Ambulatory Visit (HOSPITAL_COMMUNITY)
Admission: RE | Admit: 2023-03-03 | Discharge: 2023-03-03 | Disposition: A | Discharge status: Home / Self Care | Payer: 59 | Attending: Optometry | Admitting: Optometry

## 2023-03-03 DIAGNOSIS — K219 Gastro-esophageal reflux disease without esophagitis: Secondary | ICD-10-CM | POA: Insufficient documentation

## 2023-03-03 DIAGNOSIS — H2512 Age-related nuclear cataract, left eye: Secondary | ICD-10-CM | POA: Diagnosis not present

## 2023-03-03 HISTORY — PX: CATARACT EXTRACTION W/PHACO: SHX586

## 2023-03-03 SURGERY — PHACOEMULSIFICATION, CATARACT, WITH IOL INSERTION
Anesthesia: Monitor Anesthesia Care | Site: Eye | Laterality: Left

## 2023-03-03 MED ORDER — PHENYLEPHRINE-KETOROLAC 1-0.3 % IO SOLN
INTRAOCULAR | Status: DC | PRN
  Administered 2023-03-03: 500 mL via OPHTHALMIC

## 2023-03-03 MED ORDER — PHENYLEPHRINE HCL 2.5 % OP SOLN
1.0000 [drp] | OPHTHALMIC | Status: AC | PRN
  Administered 2023-03-03 (×3): 1 [drp] via OPHTHALMIC

## 2023-03-03 MED ORDER — FENTANYL CITRATE (PF) 100 MCG/2ML IJ SOLN
INTRAMUSCULAR | Status: AC
  Filled 2023-03-03: qty 2

## 2023-03-03 MED ORDER — BSS IO SOLN
INTRAOCULAR | Status: DC | PRN
  Administered 2023-03-03: 15 mL via INTRAOCULAR

## 2023-03-03 MED ORDER — FENTANYL CITRATE (PF) 100 MCG/2ML IJ SOLN
INTRAMUSCULAR | Status: DC | PRN
  Administered 2023-03-03: 50 ug via INTRAVENOUS

## 2023-03-03 MED ORDER — TETRACAINE HCL 0.5 % OP SOLN
1.0000 [drp] | OPHTHALMIC | Status: AC | PRN
  Administered 2023-03-03 (×3): 1 [drp] via OPHTHALMIC

## 2023-03-03 MED ORDER — SODIUM CHLORIDE 0.9% FLUSH
INTRAVENOUS | Status: DC | PRN
  Administered 2023-03-03: 5 mL via INTRAVENOUS

## 2023-03-03 MED ORDER — LIDOCAINE HCL 3.5 % OP GEL
1.0000 | Freq: Once | OPHTHALMIC | Status: AC
  Administered 2023-03-03: 1 via OPHTHALMIC

## 2023-03-03 MED ORDER — LIDOCAINE HCL (PF) 1 % IJ SOLN
INTRAMUSCULAR | Status: DC | PRN
  Administered 2023-03-03: 2 mL

## 2023-03-03 MED ORDER — PHENYLEPHRINE-KETOROLAC 1-0.3 % IO SOLN
INTRAOCULAR | Status: AC
  Filled 2023-03-03: qty 4

## 2023-03-03 MED ORDER — LACTATED RINGERS IV SOLN: INTRAVENOUS | Status: DC

## 2023-03-03 MED ORDER — TROPICAMIDE 1 % OP SOLN
1.0000 [drp] | OPHTHALMIC | Status: AC | PRN
  Administered 2023-03-03 (×3): 1 [drp] via OPHTHALMIC

## 2023-03-03 MED ORDER — STERILE WATER FOR IRRIGATION IR SOLN
Status: DC | PRN
  Administered 2023-03-03: 250 mL

## 2023-03-03 MED ORDER — MOXIFLOXACIN HCL 5 MG/ML IO SOLN
INTRAOCULAR | Status: DC | PRN
  Administered 2023-03-03: .2 mL via INTRACAMERAL

## 2023-03-03 MED ORDER — POVIDONE-IODINE 5 % OP SOLN
OPHTHALMIC | Status: DC | PRN
  Administered 2023-03-03: 1 via OPHTHALMIC

## 2023-03-03 MED ORDER — SIGHTPATH DOSE#1 NA HYALUR & NA CHOND-NA HYALUR IO KIT
PACK | INTRAOCULAR | Status: DC | PRN
  Administered 2023-03-03: 1 via OPHTHALMIC

## 2023-03-03 SURGICAL SUPPLY — 15 items
CATARACT SUITE SIGHTPATH (MISCELLANEOUS) ×1
CLOTH BEACON ORANGE TIMEOUT ST (SAFETY) ×1 IMPLANT
DRSG TEGADERM 4X4.75 (GAUZE/BANDAGES/DRESSINGS) ×1 IMPLANT
EYE SHIELD UNIVERSAL CLEAR (GAUZE/BANDAGES/DRESSINGS) IMPLANT
FEE CATARACT SUITE SIGHTPATH (MISCELLANEOUS) ×1 IMPLANT
GLOVE BIOGEL PI IND STRL 7.0 (GLOVE) ×2 IMPLANT
LENS IOL TECNIS EYHANCE 23.0 (Intraocular Lens) IMPLANT
NDL HYPO 18GX1.5 BLUNT FILL (NEEDLE) ×1 IMPLANT
NEEDLE HYPO 18GX1.5 BLUNT FILL (NEEDLE) ×1
PAD ARMBOARD 7.5X6 YLW CONV (MISCELLANEOUS) ×1 IMPLANT
POSITIONER HEAD 8X9X4 ADT (SOFTGOODS) ×1 IMPLANT
RING MALYGIN 7.0 (MISCELLANEOUS) IMPLANT
SYR TB 1ML LL NO SAFETY (SYRINGE) ×1 IMPLANT
TAPE SURG TRANSPORE 1 IN (GAUZE/BANDAGES/DRESSINGS) IMPLANT
WATER STERILE IRR 250ML POUR (IV SOLUTION) ×1 IMPLANT

## 2023-03-03 NOTE — Discharge Instructions (Signed)
Please discharge patient when stable, will follow up today with Dr. Tod Abrahamsen at the [facility] office immediately following discharge.  Leave shield in place until visit.  All paperwork with discharge instructions will be given at the office.  [facility] Address:  [address]  [place], [place] [address]  Dr. Feliciano Wynter's Phone: [phone]  

## 2023-03-03 NOTE — Interval H&P Note (Signed)
History and Physical Interval Note:  03/03/2023 10:31 AM  The H and P was reviewed and updated. The patient was examined.  No changes were found after exam.  The surgical eye was marked.  Dominique Brooks

## 2023-03-03 NOTE — Anesthesia Postprocedure Evaluation (Signed)
Anesthesia Post Note  Patient: Dominique Brooks  Procedure(s) Performed: CATARACT EXTRACTION PHACO AND INTRAOCULAR LENS PLACEMENT (IOC) (Left: Eye)  Patient location during evaluation: PACU Anesthesia Type: MAC Level of consciousness: awake and alert Pain management: pain level controlled Vital Signs Assessment: post-procedure vital signs reviewed and stable Respiratory status: spontaneous breathing, nonlabored ventilation, respiratory function stable and patient connected to nasal cannula oxygen Cardiovascular status: stable and blood pressure returned to baseline Postop Assessment: no apparent nausea or vomiting Anesthetic complications: no   There were no known notable events for this encounter.   Last Vitals:  Vitals:   03/03/23 1034 03/03/23 1122  BP: 129/84 (!) 145/90  Pulse: 85 90  Resp: 12 14  Temp: 36.4 C   SpO2: 95% 98%    Last Pain:  Vitals:   03/03/23 1122  TempSrc:   PainSc: 0-No pain                 Kaysee Hergert L Clever Geraldo

## 2023-03-03 NOTE — Transfer of Care (Signed)
Immediate Anesthesia Transfer of Care Note  Patient: Dominique Brooks  Procedure(s) Performed: CATARACT EXTRACTION PHACO AND INTRAOCULAR LENS PLACEMENT (IOC) (Left: Eye)  Patient Location: Short Stay  Anesthesia Type:MAC  Level of Consciousness: awake and patient cooperative  Airway & Oxygen Therapy: Patient Spontanous Breathing  Post-op Assessment: Report given to RN and Post -op Vital signs reviewed and stable  Post vital signs: Reviewed and stable  Last Vitals:  Vitals Value Taken Time  BP 145/90 03/03/23 1122  Temp    Pulse 90 03/03/23 1122  Resp 14 03/03/23 1122  SpO2 98 % 03/03/23 1122    Last Pain:  Vitals:   03/03/23 1122  TempSrc:   PainSc: 0-No pain         Complications: No notable events documented.

## 2023-03-03 NOTE — Op Note (Signed)
Date of procedure: 03/03/23  Pre-operative diagnosis: Visually significant age-related nuclear cataract, Left Eye (H25.12)  Post-operative diagnosis: Visually significant age-related nuclear cataract, Left Eye  Procedure: Removal of cataract via phacoemulsification and insertion of intra-ocular lens J&J DIB00 +23.0D into the capsular bag of the Left Eye  Attending surgeon: Ronal Fear, MD  Anesthesia: MAC, Topical Akten  Complications: None  Estimated Blood Loss: <71mL (minimal)  Specimens: None  Implants:  Implant Name Type Inv. Item Serial No. Manufacturer Lot No. LRB No. Used Action  LENS IOL TECNIS EYHANCE 23.0 - [id number] Intraocular Lens LENS IOL TECNIS EYHANCE 23.0 [id number] [employer]  Left 1 Implanted    Indications:  Visually significant age-related cataract, Left Eye  Procedure:  The patient was seen and identified in the pre-operative area. The operative eye was identified and dilated.  The operative eye was marked.  Topical anesthesia was administered to the operative eye.     The patient was then to the operative suite and placed in the supine position.  A timeout was performed confirming the patient, procedure to be performed, and all other relevant information.   The patient's face was prepped and draped in the usual fashion for intra-ocular surgery.  A lid speculum was placed into the operative eye and the surgical microscope moved into place and focused.  An inferotemporal paracentesis was created using a 20 gauge paracentesis blade.  BSS mixed with Omidria, followed by 1% lidocaine was injected into the anterior chamber.  Viscoelastic was injected into the anterior chamber.  A temporal clear-corneal main wound incision was created using a 2.50mm microkeratome.  A continuous curvilinear capsulorrhexis was initiated using an irrigating cystitome and completed using capsulorrhexis forceps.  Hydrodissection and hydrodeliniation were performed.  Viscoelastic was  injected into the anterior chamber.  A phacoemulsification handpiece and a chopper as a second instrument were used to remove the nucleus and epinucleus. The irrigation/aspiration handpiece was used to remove any remaining cortical material.   The capsular bag was reinflated with viscoelastic, checked, and found to be intact.  The intraocular lens was inserted into the capsular bag.  The irrigation/aspiration handpiece was used to remove any remaining viscoelastic.  The clear corneal wound and paracentesis wounds were then hydrated and checked with Weck-Cels to be watertight. Moxifloxacin was instilled into the anterior chamber.  The lid-speculum and drape was removed, and the patient's face was cleaned with a wet and dry 4x4.  A clear shield was taped over the eye. The patient was taken to the post-operative care unit in good condition, having tolerated the procedure well.  Post-Op Instructions: The patient will follow up at [facility] for a same day post-operative evaluation and will receive all other orders and instructions.

## 2023-03-03 NOTE — Anesthesia Preprocedure Evaluation (Addendum)
Anesthesia Evaluation  Patient identified by MRN, date of birth, ID band Patient awake    Reviewed: Allergy & Precautions, H&P , NPO status , Patient's Chart, lab work & pertinent test results, reviewed documented beta blocker date and time   Airway Mallampati: II  TM Distance: >3 FB Neck ROM: full    Dental  (+) Dental Advisory Given, Missing, Poor Dentition   Pulmonary neg pulmonary ROS   Pulmonary exam normal breath sounds clear to auscultation       Cardiovascular Exercise Tolerance: Good + Valvular Problems/Murmurs  Rhythm:regular Rate:Normal  Heart murmer   Neuro/Psych negative neurological ROS  negative psych ROS   GI/Hepatic negative GI ROS, Neg liver ROS,GERD  ,,  Endo/Other  negative endocrine ROS    Renal/GU negative Renal ROS  negative genitourinary   Musculoskeletal   Abdominal Normal abdominal exam  (+)   Peds  Hematology negative hematology ROS (+)   Anesthesia Other Findings   Reproductive/Obstetrics negative OB ROS                             Anesthesia Physical Anesthesia Plan  ASA: 2  Anesthesia Plan: MAC   Post-op Pain Management:    Induction:   PONV Risk Score and Plan:   Airway Management Planned: Nasal Cannula and Natural Airway  Additional Equipment: None  Intra-op Plan:   Post-operative Plan:   Informed Consent: I have reviewed the patients History and Physical, chart, labs and discussed the procedure including the risks, benefits and alternatives for the proposed anesthesia with the patient or authorized representative who has indicated his/her understanding and acceptance.     Dental Advisory Given  Plan Discussed with: CRNA  Anesthesia Plan Comments:        Anesthesia Quick Evaluation

## 2023-03-08 ENCOUNTER — Encounter (HOSPITAL_COMMUNITY): Payer: Self-pay | Admitting: Optometry

## 2023-04-11 ENCOUNTER — Encounter (INDEPENDENT_AMBULATORY_CARE_PROVIDER_SITE_OTHER): Payer: 59 | Admitting: Ophthalmology

## 2023-04-14 ENCOUNTER — Encounter (INDEPENDENT_AMBULATORY_CARE_PROVIDER_SITE_OTHER): Payer: 59 | Admitting: Ophthalmology

## 2023-04-14 DIAGNOSIS — H43813 Vitreous degeneration, bilateral: Secondary | ICD-10-CM | POA: Diagnosis not present

## 2023-04-14 DIAGNOSIS — H59032 Cystoid macular edema following cataract surgery, left eye: Secondary | ICD-10-CM | POA: Diagnosis not present

## 2023-04-17 ENCOUNTER — Encounter (INDEPENDENT_AMBULATORY_CARE_PROVIDER_SITE_OTHER): Payer: 59 | Admitting: Ophthalmology

## 2023-04-18 DIAGNOSIS — M47812 Spondylosis without myelopathy or radiculopathy, cervical region: Secondary | ICD-10-CM | POA: Diagnosis not present

## 2023-05-12 ENCOUNTER — Encounter (INDEPENDENT_AMBULATORY_CARE_PROVIDER_SITE_OTHER): Payer: Self-pay | Admitting: *Deleted

## 2023-05-23 DIAGNOSIS — L821 Other seborrheic keratosis: Secondary | ICD-10-CM | POA: Diagnosis not present

## 2023-05-23 DIAGNOSIS — D2271 Melanocytic nevi of right lower limb, including hip: Secondary | ICD-10-CM | POA: Diagnosis not present

## 2023-05-23 DIAGNOSIS — D2272 Melanocytic nevi of left lower limb, including hip: Secondary | ICD-10-CM | POA: Diagnosis not present

## 2023-05-23 DIAGNOSIS — Z85828 Personal history of other malignant neoplasm of skin: Secondary | ICD-10-CM | POA: Diagnosis not present

## 2023-05-23 DIAGNOSIS — L820 Inflamed seborrheic keratosis: Secondary | ICD-10-CM | POA: Diagnosis not present

## 2023-05-23 DIAGNOSIS — D2262 Melanocytic nevi of left upper limb, including shoulder: Secondary | ICD-10-CM | POA: Diagnosis not present

## 2023-05-23 DIAGNOSIS — D225 Melanocytic nevi of trunk: Secondary | ICD-10-CM | POA: Diagnosis not present

## 2023-05-23 DIAGNOSIS — L858 Other specified epidermal thickening: Secondary | ICD-10-CM | POA: Diagnosis not present

## 2023-05-23 DIAGNOSIS — L905 Scar conditions and fibrosis of skin: Secondary | ICD-10-CM | POA: Diagnosis not present

## 2023-05-26 ENCOUNTER — Encounter (INDEPENDENT_AMBULATORY_CARE_PROVIDER_SITE_OTHER): Payer: 59 | Admitting: Ophthalmology

## 2023-05-26 DIAGNOSIS — H43813 Vitreous degeneration, bilateral: Secondary | ICD-10-CM | POA: Diagnosis not present

## 2023-05-26 DIAGNOSIS — H59032 Cystoid macular edema following cataract surgery, left eye: Secondary | ICD-10-CM

## 2023-06-04 ENCOUNTER — Emergency Department (HOSPITAL_COMMUNITY)
Admission: EM | Admit: 2023-06-04 | Discharge: 2023-06-04 | Disposition: A | Discharge status: Home / Self Care | Payer: 59

## 2023-06-04 ENCOUNTER — Other Ambulatory Visit: Payer: Self-pay

## 2023-06-04 ENCOUNTER — Encounter (HOSPITAL_COMMUNITY): Payer: Self-pay

## 2023-06-04 DIAGNOSIS — M545 Low back pain, unspecified: Secondary | ICD-10-CM | POA: Diagnosis not present

## 2023-06-04 DIAGNOSIS — G8929 Other chronic pain: Secondary | ICD-10-CM | POA: Diagnosis not present

## 2023-06-04 DIAGNOSIS — M549 Dorsalgia, unspecified: Secondary | ICD-10-CM | POA: Diagnosis not present

## 2023-06-04 DIAGNOSIS — Z743 Need for continuous supervision: Secondary | ICD-10-CM | POA: Diagnosis not present

## 2023-06-04 DIAGNOSIS — R6889 Other general symptoms and signs: Secondary | ICD-10-CM | POA: Diagnosis not present

## 2023-06-04 MED ORDER — ONDANSETRON 4 MG PO TBDP
4.0000 mg | ORAL_TABLET | Freq: Once | ORAL | Status: AC
  Administered 2023-06-04: 4 mg via ORAL
  Filled 2023-06-04: qty 1

## 2023-06-04 MED ORDER — KETOROLAC TROMETHAMINE 15 MG/ML IJ SOLN
15.0000 mg | Freq: Once | INTRAMUSCULAR | Status: AC
  Administered 2023-06-04: 15 mg via INTRAMUSCULAR
  Filled 2023-06-04: qty 1

## 2023-06-04 MED ORDER — ACETAMINOPHEN 325 MG PO TABS
650.0000 mg | ORAL_TABLET | Freq: Once | ORAL | Status: AC
  Administered 2023-06-04: 650 mg via ORAL
  Filled 2023-06-04: qty 2

## 2023-06-04 MED ORDER — METHOCARBAMOL 500 MG PO TABS
500.0000 mg | ORAL_TABLET | Freq: Once | ORAL | Status: AC
  Administered 2023-06-04: 500 mg via ORAL
  Filled 2023-06-04: qty 1

## 2023-06-04 MED ORDER — LIDOCAINE 5 % EX PTCH
1.0000 | MEDICATED_PATCH | Freq: Once | CUTANEOUS | Status: DC
  Administered 2023-06-04: 1 via TRANSDERMAL
  Filled 2023-06-04: qty 1

## 2023-06-04 MED ORDER — ONDANSETRON HCL 4 MG PO TABS
4.0000 mg | ORAL_TABLET | Freq: Four times a day (QID) | ORAL | 0 refills | Status: AC
Start: 2023-06-04 — End: ?

## 2023-06-04 MED ORDER — METHOCARBAMOL 500 MG PO TABS: 500.0000 mg | ORAL_TABLET | Freq: Three times a day (TID) | ORAL | 0 refills | Status: AC | PRN

## 2023-06-04 MED ORDER — LIDOCAINE 4 % EX PTCH: 1.0000 | MEDICATED_PATCH | CUTANEOUS | 0 refills | Status: AC

## 2023-06-04 NOTE — Discharge Instructions (Addendum)
As discussed he may take over-the-counter Tylenol alternating with ibuprofen for pain.  You may use lidocaine patches as well.  Please take the muscle relaxers at night to help with symptoms.  Please follow-up with your primary doctor.  Return immediately for fevers, chills, chest pain, shortness of breath, weakness in your lower extremities, numbness in your genital area, bowel or bladder incontinence or any new or worsening symptoms that are concerning to you.

## 2023-06-04 NOTE — ED Provider Notes (Signed)
[place] EMERGENCY DEPARTMENT AT [facility] Provider Note   CSN: [id number] Arrival date & time: 06/04/23  8657     History  Chief Complaint  Patient presents with   Hip Pain    Dominique Brooks is a 73 y.o. female.  73 year old female presenting to the emergency department for back pain.  She reports chronic back pain, symptoms currently worse over the past 6 days.  No trauma, no neurologic symptoms, no saddle anesthesia, no constitutional symptoms, no fevers.  Symptoms seemingly exacerbated by being active at church yesterday.   Hip Pain       Home Medications Prior to Admission medications   Medication Sig Start Date End Date Taking? Authorizing Provider  ALPRAZolam Prudy Feeler) 0.5 MG tablet Take 0.25-0.5 mg by mouth 2 (two) times daily as needed (muscle spasms).    [provider]  fexofenadine (ALLEGRA) 180 MG tablet Take 180 mg by mouth daily.    [provider]  FLUoxetine (PROZAC) 40 MG capsule Take 40 mg by mouth daily. 11/07/20   [provider]  ibuprofen (ADVIL) 200 MG tablet Take 600 mg by mouth every 6 (six) hours as needed for mild pain.    [provider]  Multiple Vitamins-Minerals (MULTIVITAMIN WITH MINERALS) tablet Take 1 tablet by mouth daily.    [provider]  omeprazole (PRILOSEC) 20 MG capsule Take 20 mg by mouth daily. 07/09/20   [provider]  oxyCODONE (OXY IR/ROXICODONE) 5 MG immediate release tablet Take 1 pills every 4-6 hrs as needed for pain Patient not taking: Reported on 04/14/2023 12/18/20   McBane, Jerald Kief, PA-C  SF 5000 PLUS 1.1 % CREA dental cream Take 1 application by mouth at bedtime. 10/20/20   [provider]  traZODone (DESYREL) 50 MG tablet Take 50 mg by mouth at bedtime as needed for sleep. Patient not taking: Reported on 04/14/2023 11/16/20   [provider]  zolpidem (AMBIEN) 10 MG tablet Take 10 mg by mouth at bedtime as needed for sleep.    [provider]      Allergies    Sulfa antibiotics    Review of Systems   Review of Systems  Physical Exam Updated Vital Signs BP (!) 160/93   Pulse 98   Temp 98 F (36.7 C) (Oral)   Resp 20   Ht 5\' 7"  (1.702 m)   Wt 82 kg   SpO2 92%   BMI 28.31 kg/m  Physical Exam Vitals and nursing note reviewed.  Constitutional:      General: She is not in acute distress.    Appearance: She is not toxic-appearing.  HENT:     Head: Normocephalic.     Mouth/Throat:     Mouth: Mucous membranes are moist.  Eyes:     Conjunctiva/sclera: Conjunctivae normal.  Cardiovascular:     Pulses: Normal pulses.  Pulmonary:     Effort: Pulmonary effort is normal.  Abdominal:     General: Abdomen is flat.  Musculoskeletal:        General: Normal range of motion.  Skin:    General: Skin is warm and dry.     Capillary Refill: Capillary refill takes less than 2 seconds.  Neurological:     Mental Status: She is alert and oriented to person, place, and time.  Psychiatric:        Mood and Affect: Mood normal.        Behavior: Behavior normal.     ED Results /  Procedures / Treatments   Labs (all labs ordered are listed, but only abnormal results are displayed) Labs Reviewed - No data to display  EKG None  Radiology No results found.  Procedures Procedures    Medications Ordered in ED Medications  lidocaine (LIDODERM) 5 % 1 patch (1 patch Transdermal Patch Applied 06/04/23 0753)  acetaminophen (TYLENOL) tablet 650 mg (650 mg Oral Given 06/04/23 0756)  ketorolac (TORADOL) 15 MG/ML injection 15 mg (15 mg Intramuscular Given 06/04/23 0753)  methocarbamol (ROBAXIN) tablet 500 mg (500 mg Oral Given 06/04/23 0757)  ondansetron (ZOFRAN-ODT) disintegrating tablet 4 mg (4 mg Oral Given 06/04/23 7564)    ED Course/ Medical Decision Making/ A&P Clinical Course as of 06/04/23 0913  Sun Jun 04, 2023  0911 Reevaluated, patient with improvement after pain medications. Stable for discharge.  [TY]     Clinical Course User Index [TY] Coral Spikes, DO                                 Medical Decision Making 73 year old female presenting emergency department for acute on chronic back pain.  Afebrile vital signs reassuring.  No red flags on history or physical.  Patient treated with multimodal pain medications improvement of her symptoms.  Discussed follow-up with primary doctor.  Will discharge with lidocaine patches and muscle relaxer.  Discussed increased risk of falls and confusion.  Patient states she would only take them at night for sleep.  Risk OTC drugs. Prescription drug management.         Final Clinical Impression(s) / ED Diagnoses Final diagnoses:  None    Rx / DC Orders ED Discharge Orders     None         Coral Spikes, DO 06/04/23 3329

## 2023-06-12 DIAGNOSIS — M542 Cervicalgia: Secondary | ICD-10-CM | POA: Diagnosis not present

## 2023-06-12 DIAGNOSIS — Z1231 Encounter for screening mammogram for malignant neoplasm of breast: Secondary | ICD-10-CM | POA: Diagnosis not present

## 2023-06-12 DIAGNOSIS — K5909 Other constipation: Secondary | ICD-10-CM | POA: Diagnosis not present

## 2023-06-23 DIAGNOSIS — R509 Fever, unspecified: Secondary | ICD-10-CM | POA: Diagnosis not present

## 2023-07-18 DIAGNOSIS — M47812 Spondylosis without myelopathy or radiculopathy, cervical region: Secondary | ICD-10-CM | POA: Diagnosis not present

## 2023-07-18 DIAGNOSIS — M791 Myalgia, unspecified site: Secondary | ICD-10-CM | POA: Diagnosis not present

## 2023-08-04 ENCOUNTER — Encounter (INDEPENDENT_AMBULATORY_CARE_PROVIDER_SITE_OTHER): Payer: 59 | Admitting: Ophthalmology

## 2023-08-04 DIAGNOSIS — H59032 Cystoid macular edema following cataract surgery, left eye: Secondary | ICD-10-CM

## 2023-08-04 DIAGNOSIS — H43813 Vitreous degeneration, bilateral: Secondary | ICD-10-CM | POA: Diagnosis not present

## 2023-08-04 DIAGNOSIS — Z961 Presence of intraocular lens: Secondary | ICD-10-CM | POA: Diagnosis not present

## 2023-08-11 DIAGNOSIS — Z0000 Encounter for general adult medical examination without abnormal findings: Secondary | ICD-10-CM | POA: Diagnosis not present

## 2023-08-11 DIAGNOSIS — I10 Essential (primary) hypertension: Secondary | ICD-10-CM | POA: Diagnosis not present

## 2023-08-11 DIAGNOSIS — K219 Gastro-esophageal reflux disease without esophagitis: Secondary | ICD-10-CM | POA: Diagnosis not present

## 2023-08-11 DIAGNOSIS — Z131 Encounter for screening for diabetes mellitus: Secondary | ICD-10-CM | POA: Diagnosis not present

## 2023-08-11 DIAGNOSIS — M25511 Pain in right shoulder: Secondary | ICD-10-CM | POA: Diagnosis not present

## 2023-08-11 DIAGNOSIS — R946 Abnormal results of thyroid function studies: Secondary | ICD-10-CM | POA: Diagnosis not present

## 2023-08-11 DIAGNOSIS — Z13220 Encounter for screening for lipoid disorders: Secondary | ICD-10-CM | POA: Diagnosis not present

## 2023-08-11 DIAGNOSIS — E559 Vitamin D deficiency, unspecified: Secondary | ICD-10-CM | POA: Diagnosis not present

## 2023-08-16 DIAGNOSIS — I10 Essential (primary) hypertension: Secondary | ICD-10-CM | POA: Diagnosis not present

## 2023-08-16 DIAGNOSIS — E782 Mixed hyperlipidemia: Secondary | ICD-10-CM | POA: Diagnosis not present

## 2023-08-16 DIAGNOSIS — K2100 Gastro-esophageal reflux disease with esophagitis, without bleeding: Secondary | ICD-10-CM | POA: Diagnosis not present

## 2023-08-16 DIAGNOSIS — Z0001 Encounter for general adult medical examination with abnormal findings: Secondary | ICD-10-CM | POA: Diagnosis not present

## 2023-09-05 DIAGNOSIS — R350 Frequency of micturition: Secondary | ICD-10-CM | POA: Diagnosis not present

## 2023-09-05 DIAGNOSIS — M545 Low back pain, unspecified: Secondary | ICD-10-CM | POA: Diagnosis not present

## 2023-10-30 ENCOUNTER — Encounter (INDEPENDENT_AMBULATORY_CARE_PROVIDER_SITE_OTHER): Admitting: Ophthalmology

## 2023-10-30 DIAGNOSIS — H43813 Vitreous degeneration, bilateral: Secondary | ICD-10-CM

## 2023-11-03 ENCOUNTER — Encounter (INDEPENDENT_AMBULATORY_CARE_PROVIDER_SITE_OTHER): Admitting: Ophthalmology

## 2023-11-23 DIAGNOSIS — K5909 Other constipation: Secondary | ICD-10-CM | POA: Diagnosis not present

## 2023-11-23 DIAGNOSIS — M542 Cervicalgia: Secondary | ICD-10-CM | POA: Diagnosis not present

## 2023-12-01 DIAGNOSIS — M4722 Other spondylosis with radiculopathy, cervical region: Secondary | ICD-10-CM | POA: Diagnosis not present

## 2023-12-01 DIAGNOSIS — M542 Cervicalgia: Secondary | ICD-10-CM | POA: Diagnosis not present

## 2023-12-05 DIAGNOSIS — M791 Myalgia, unspecified site: Secondary | ICD-10-CM | POA: Diagnosis not present

## 2023-12-05 DIAGNOSIS — M47812 Spondylosis without myelopathy or radiculopathy, cervical region: Secondary | ICD-10-CM | POA: Diagnosis not present

## 2023-12-07 ENCOUNTER — Other Ambulatory Visit (HOSPITAL_BASED_OUTPATIENT_CLINIC_OR_DEPARTMENT_OTHER): Payer: Self-pay

## 2023-12-26 DIAGNOSIS — E7849 Other hyperlipidemia: Secondary | ICD-10-CM | POA: Diagnosis not present

## 2023-12-26 DIAGNOSIS — K2100 Gastro-esophageal reflux disease with esophagitis, without bleeding: Secondary | ICD-10-CM | POA: Diagnosis not present

## 2023-12-26 DIAGNOSIS — E782 Mixed hyperlipidemia: Secondary | ICD-10-CM | POA: Diagnosis not present

## 2023-12-26 DIAGNOSIS — I10 Essential (primary) hypertension: Secondary | ICD-10-CM | POA: Diagnosis not present

## 2024-01-05 DIAGNOSIS — M47812 Spondylosis without myelopathy or radiculopathy, cervical region: Secondary | ICD-10-CM | POA: Diagnosis not present

## 2024-01-05 DIAGNOSIS — M5412 Radiculopathy, cervical region: Secondary | ICD-10-CM | POA: Diagnosis not present

## 2024-01-09 DIAGNOSIS — Z85828 Personal history of other malignant neoplasm of skin: Secondary | ICD-10-CM | POA: Diagnosis not present

## 2024-01-09 DIAGNOSIS — L821 Other seborrheic keratosis: Secondary | ICD-10-CM | POA: Diagnosis not present

## 2024-01-09 DIAGNOSIS — L570 Actinic keratosis: Secondary | ICD-10-CM | POA: Diagnosis not present

## 2024-01-09 DIAGNOSIS — D485 Neoplasm of uncertain behavior of skin: Secondary | ICD-10-CM | POA: Diagnosis not present

## 2024-02-01 DIAGNOSIS — M5412 Radiculopathy, cervical region: Secondary | ICD-10-CM | POA: Diagnosis not present

## 2024-02-01 DIAGNOSIS — M50122 Cervical disc disorder at C5-C6 level with radiculopathy: Secondary | ICD-10-CM | POA: Diagnosis not present

## 2024-02-01 DIAGNOSIS — M5011 Cervical disc disorder with radiculopathy,  high cervical region: Secondary | ICD-10-CM | POA: Diagnosis not present

## 2024-02-01 DIAGNOSIS — M50121 Cervical disc disorder at C4-C5 level with radiculopathy: Secondary | ICD-10-CM | POA: Diagnosis not present

## 2024-02-23 DIAGNOSIS — M5412 Radiculopathy, cervical region: Secondary | ICD-10-CM | POA: Diagnosis not present
